# Patient Record
Sex: Male | Born: 1995 | Hispanic: Yes | Marital: Single | Smoking: Never smoker
Health system: Southern US, Community
[De-identification: ages and names within clinical notes are randomized; demographics above are authoritative.]

## PROBLEM LIST (undated history)

## (undated) DIAGNOSIS — F419 Anxiety disorder, unspecified: Secondary | ICD-10-CM

## (undated) DIAGNOSIS — F32A Depression, unspecified: Secondary | ICD-10-CM

## (undated) HISTORY — DX: Anxiety disorder, unspecified: F41.9

## (undated) HISTORY — DX: Depression, unspecified: F32.A

---
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
## 2017-02-01 ENCOUNTER — Emergency Department (HOSPITAL_BASED_OUTPATIENT_CLINIC_OR_DEPARTMENT_OTHER): Payer: BLUE CROSS/BLUE SHIELD

## 2017-02-01 ENCOUNTER — Emergency Department (HOSPITAL_BASED_OUTPATIENT_CLINIC_OR_DEPARTMENT_OTHER)
Admission: EM | Admit: 2017-02-01 | Discharge: 2017-02-01 | Disposition: A | Discharge status: Home / Self Care | Payer: BLUE CROSS/BLUE SHIELD | Attending: Emergency Medicine | Admitting: Emergency Medicine

## 2017-02-01 ENCOUNTER — Encounter (HOSPITAL_BASED_OUTPATIENT_CLINIC_OR_DEPARTMENT_OTHER): Payer: Self-pay | Admitting: *Deleted

## 2017-02-01 DIAGNOSIS — R197 Diarrhea, unspecified: Secondary | ICD-10-CM | POA: Diagnosis not present

## 2017-02-01 DIAGNOSIS — R1031 Right lower quadrant pain: Secondary | ICD-10-CM | POA: Diagnosis present

## 2017-02-01 DIAGNOSIS — N433 Hydrocele, unspecified: Secondary | ICD-10-CM

## 2017-02-01 DIAGNOSIS — N5082 Scrotal pain: Secondary | ICD-10-CM

## 2017-02-01 LAB — CBC WITH DIFFERENTIAL/PLATELET
Basophils Absolute: 0 10*3/uL (ref 0.0–0.1)
Basophils Relative: 0 %
EOS ABS: 0.1 10*3/uL (ref 0.0–0.7)
EOS PCT: 1 %
HCT: 42.1 % (ref 39.0–52.0)
HEMOGLOBIN: 14.5 g/dL (ref 13.0–17.0)
LYMPHS ABS: 1.7 10*3/uL (ref 0.7–4.0)
LYMPHS PCT: 22 %
MCH: 29.7 pg (ref 26.0–34.0)
MCHC: 34.4 g/dL (ref 30.0–36.0)
MCV: 86.3 fL (ref 78.0–100.0)
MONOS PCT: 10 %
Monocytes Absolute: 0.8 10*3/uL (ref 0.1–1.0)
Neutro Abs: 5.2 10*3/uL (ref 1.7–7.7)
Neutrophils Relative %: 67 %
PLATELETS: 231 10*3/uL (ref 150–400)
RBC: 4.88 MIL/uL (ref 4.22–5.81)
RDW: 13.3 % (ref 11.5–15.5)
WBC: 7.7 10*3/uL (ref 4.0–10.5)

## 2017-02-01 LAB — COMPREHENSIVE METABOLIC PANEL
ALK PHOS: 50 U/L (ref 38–126)
ALT: 13 U/L — AB (ref 17–63)
ANION GAP: 6 (ref 5–15)
AST: 14 U/L — ABNORMAL LOW (ref 15–41)
Albumin: 4.3 g/dL (ref 3.5–5.0)
BUN: 17 mg/dL (ref 6–20)
CALCIUM: 9.1 mg/dL (ref 8.9–10.3)
CO2: 26 mmol/L (ref 22–32)
CREATININE: 0.86 mg/dL (ref 0.61–1.24)
Chloride: 106 mmol/L (ref 101–111)
Glucose, Bld: 82 mg/dL (ref 65–99)
Potassium: 4 mmol/L (ref 3.5–5.1)
SODIUM: 138 mmol/L (ref 135–145)
Total Bilirubin: 0.8 mg/dL (ref 0.3–1.2)
Total Protein: 7.4 g/dL (ref 6.5–8.1)

## 2017-02-01 LAB — URINALYSIS, ROUTINE W REFLEX MICROSCOPIC
Bilirubin Urine: NEGATIVE
Glucose, UA: NEGATIVE mg/dL
Hgb urine dipstick: NEGATIVE
Ketones, ur: NEGATIVE mg/dL
Leukocytes, UA: NEGATIVE
Nitrite: NEGATIVE
Protein, ur: NEGATIVE mg/dL
Specific Gravity, Urine: 1.027 (ref 1.005–1.030)
pH: 6 (ref 5.0–8.0)

## 2017-02-01 MED ORDER — ONDANSETRON HCL 4 MG/2ML IJ SOLN
4.0000 mg | Freq: Once | INTRAMUSCULAR | Status: AC
  Administered 2017-02-01: 4 mg via INTRAVENOUS
  Filled 2017-02-01: qty 2

## 2017-02-01 MED ORDER — DOXYCYCLINE HYCLATE 100 MG PO TABS
100.0000 mg | ORAL_TABLET | Freq: Once | ORAL | Status: AC
Start: 2017-02-01 — End: 2017-02-01
  Administered 2017-02-01: 100 mg via ORAL
  Filled 2017-02-01: qty 1

## 2017-02-01 MED ORDER — CEFTRIAXONE SODIUM 250 MG IJ SOLR
250.0000 mg | Freq: Once | INTRAMUSCULAR | Status: AC
  Administered 2017-02-01: 250 mg via INTRAMUSCULAR
  Filled 2017-02-01: qty 250

## 2017-02-01 MED ORDER — IOPAMIDOL (ISOVUE-300) INJECTION 61%
100.0000 mL | Freq: Once | INTRAVENOUS | Status: AC | PRN
  Administered 2017-02-01: 100 mL via INTRAVENOUS

## 2017-02-01 MED ORDER — SODIUM CHLORIDE 0.9 % IV BOLUS (SEPSIS)
1000.0000 mL | Freq: Once | INTRAVENOUS | Status: AC
  Administered 2017-02-01: 1000 mL via INTRAVENOUS

## 2017-02-01 MED ORDER — DOXYCYCLINE HYCLATE 100 MG PO CAPS: 100.0000 mg | ORAL_CAPSULE | Freq: Two times a day (BID) | ORAL | 0 refills | Status: DC

## 2017-02-01 MED ORDER — LIDOCAINE HCL (PF) 2 % IJ SOLN
INTRAMUSCULAR | Status: AC
  Filled 2017-02-01: qty 2

## 2017-02-01 MED ORDER — MORPHINE SULFATE (PF) 4 MG/ML IV SOLN
4.0000 mg | Freq: Once | INTRAVENOUS | Status: AC
  Administered 2017-02-01: 4 mg via INTRAVENOUS
  Filled 2017-02-01: qty 1

## 2017-02-01 MED ORDER — NAPROXEN 375 MG PO TABS: 375.0000 mg | ORAL_TABLET | Freq: Two times a day (BID) | ORAL | 0 refills | Status: DC

## 2017-02-01 NOTE — ED Triage Notes (Signed)
Pt reports pain to his right lower abd radiating to his low back x Saturday night. Pt is smiling and laughing at triage, in nad. Rate pain at 5/10, yesterday morning at it's worst at 7/10.denies any hematuria.

## 2017-02-01 NOTE — ED Notes (Signed)
Patient in u/s and CT at this time

## 2017-02-01 NOTE — ED Provider Notes (Addendum)
[facility]   CSN: [id number] Arrival date & time: 02/01/17  1016     History   Chief Complaint Chief Complaint  Patient presents with  . Abdominal Pain    HPI Miguel Dunn is a 21 y.o. male.  HPI Previously healthy 21 year old male who presents with dysuria and testicular pain. The patient states that for the last several days, he has had progressively worsening pain with urination. It started as urinary frequency and mild pain 3 days ago. Since then, he has developed lower, suprapubic abdominal pain that occasionally radiates into his flanks. He also has associated occasional radiation into his bilateral testes, worse on the right. He also endorses dysuria and burning with urination but no persistent urinary frequency. No nausea or vomiting. No fevers or chills. Denies any sexual contacts and states that he is not sexually active.  History reviewed. No pertinent past medical history.  There are no active problems to display for this patient.   History reviewed. No pertinent surgical history.     Home Medications    Prior to Admission medications   Medication Sig Start Date End Date Taking? Authorizing Provider  doxycycline (VIBRAMYCIN) 100 MG capsule Take 1 capsule (100 mg total) by mouth 2 (two) times daily. 02/01/17   Shaune Pollack, MD  naproxen (NAPROSYN) 375 MG tablet Take 1 tablet (375 mg total) by mouth 2 (two) times daily. 02/01/17   Shaune Pollack, MD    Family History History reviewed. No pertinent family history.  Social History Social History  Substance Use Topics  . Smoking status: Never Smoker  . Smokeless tobacco: Never Used  . Alcohol use Not on file     Allergies   Augmentin [amoxicillin-pot clavulanate]   Review of Systems Review of Systems  Constitutional: Positive for fatigue. Negative for chills and fever.  HENT: Negative for congestion and rhinorrhea.   Eyes: Negative for visual disturbance.  Respiratory:  Negative for cough and wheezing.   Cardiovascular: Negative for chest pain and leg swelling.  Gastrointestinal: Positive for abdominal pain and diarrhea. Negative for nausea and vomiting.  Genitourinary: Positive for dysuria and testicular pain. Negative for flank pain, penile swelling and scrotal swelling.  Musculoskeletal: Negative for neck pain and neck stiffness.  Skin: Negative for rash and wound.  Allergic/Immunologic: Negative for immunocompromised state.  Neurological: Negative for syncope, weakness and headaches.  All other systems reviewed and are negative.    Physical Exam Updated Vital Signs BP 122/65 (BP Location: Right Arm)   Pulse 61   Temp 97.8 F (36.6 C) (Oral)   Resp 18   Ht 5\' 11"  (1.803 m)   Wt 210 lb (95.3 kg)   SpO2 100%   BMI 29.29 kg/m   Physical Exam  Constitutional: He is oriented to person, place, and time. He appears well-developed and well-nourished. No distress.  HENT:  Head: Normocephalic and atraumatic.  Eyes: Conjunctivae are normal.  Neck: Neck supple.  Cardiovascular: Normal rate, regular rhythm and normal heart sounds.  Exam reveals no friction rub.   No murmur heard. Pulmonary/Chest: Effort normal and breath sounds normal. No respiratory distress. He has no wheezes. He has no rales.  Abdominal: Soft. Bowel sounds are normal. He exhibits no distension. There is tenderness (moderate RLQ). There is no rebound and no guarding.  Genitourinary:  Genitourinary Comments: Testes descended bilaterally. There is mild tenderness over the right posterior testicle. Cremasteric is intact bilaterally with normal testicular lie. No erythema. No penile lesions or abnormality.  Musculoskeletal: He exhibits no edema.  Neurological: He is alert and oriented to person, place, and time. He exhibits normal muscle tone.  Skin: Skin is warm. Capillary refill takes less than 2 seconds.  Psychiatric: He has a normal mood and affect.  Nursing note and vitals  reviewed.    ED Treatments / Results  Labs (all labs ordered are listed, but only abnormal results are displayed) Labs Reviewed  COMPREHENSIVE METABOLIC PANEL - Abnormal; Notable for the following:       Result Value   AST 14 (*)    ALT 13 (*)    All other components within normal limits  URINALYSIS, ROUTINE W REFLEX MICROSCOPIC  CBC WITH DIFFERENTIAL/PLATELET  GC/CHLAMYDIA PROBE AMP ([place]) NOT [facility]    EKG  EKG Interpretation None       Radiology [place] Scrotum  Result Date: 02/01/2017 CLINICAL DATA:  21 year old male with lower abdomen, back and scrotal pain for 3-4 days. Initial encounter. EXAM: SCROTAL ULTRASOUND DOPPLER ULTRASOUND OF THE TESTICLES TECHNIQUE: Complete ultrasound examination of the testicles, epididymis, and other scrotal structures was performed. Color and spectral Doppler ultrasound were also utilized to evaluate blood flow to the testicles. COMPARISON:  CT Abdomen and Pelvis 1344 hours today. FINDINGS: Right testicle Measurements: 4.7 x 2.4 x 2.8 cm. No mass or microlithiasis visualized. Left testicle Measurements: 4.3 x 2.1 x 2.7 cm. No mass or microlithiasis visualized. Right epididymis:  Normal in size and appearance. Left epididymis:  Normal in size and appearance. Hydrocele:  None visualized. Varicocele:  Borderline to mild right varicocele (images 9 and 10). Pulsed Doppler interrogation of both testes demonstrates normal low resistance arterial and venous waveforms bilaterally. IMPRESSION: No evidence of testicular torsion and negative study except for borderline to mild right varicocele. Electronically Signed   By: Odessa FlemingH  Hall M.D.   On: 02/01/2017 14:21   Ct Abdomen Pelvis W Contrast  Result Date: 02/01/2017 CLINICAL DATA:  21 year old male with lower abdominal pain greater on the right. Pain with urination/bowel movement. Initial encounter. EXAM: CT ABDOMEN AND PELVIS WITH CONTRAST TECHNIQUE: Multidetector CT imaging of the abdomen and pelvis was  performed using the standard protocol following bolus administration of intravenous contrast. CONTRAST:  100mL ISOVUE-300 IOPAMIDOL (ISOVUE-300) INJECTION 61% COMPARISON:  No comparison CT. FINDINGS: Lower chest: Lung bases clear.  Heart size within normal limits. Hepatobiliary: No hepatic lesion or calcified gallstones. Pancreas: No mass or inflammation. Spleen: Spleen top-normal in length.  No splenic mass. Adrenals/Urinary Tract: No renal or ureteral obstructing stone or evidence hydronephrosis. 1.1 cm left renal cyst. No worrisome renal or adrenal lesion. Noncontrast filled views of the urinary bladder unremarkable. Stomach/Bowel: Portions of the colon are under distended and evaluation limited. No extraluminal bowel inflammatory process, free fluid or free air. Specifically, no inflammation surrounds the appendix or terminal ileum. Vascular/Lymphatic: No aneurysm or large vessel occlusion. No adenopathy. Reproductive: Negative Other: No inguinal hernia. Musculoskeletal: Osseous abnormality. IMPRESSION: No renal or ureteral obstructing stone or evidence hydronephrosis. 1.1 cm left renal cyst. Portions of the colon are under distended and evaluation limited. No extraluminal bowel inflammatory process, free fluid or free air. Specifically, no inflammation surrounds the appendix or terminal ileum. Electronically Signed   By: Lacy DuverneySteven  Olson M.D.   On: 02/01/2017 14:08   [place] Art/ven Flow Abd Pelv Doppler Limited  Result Date: 02/01/2017 CLINICAL DATA:  21 year old male with lower abdomen, back and scrotal pain for 3-4 days. Initial encounter. EXAM: SCROTAL ULTRASOUND DOPPLER ULTRASOUND OF THE TESTICLES TECHNIQUE: Complete ultrasound  examination of the testicles, epididymis, and other scrotal structures was performed. Color and spectral Doppler ultrasound were also utilized to evaluate blood flow to the testicles. COMPARISON:  CT Abdomen and Pelvis 1344 hours today. FINDINGS: Right testicle Measurements: 4.7 x 2.4 x  2.8 cm. No mass or microlithiasis visualized. Left testicle Measurements: 4.3 x 2.1 x 2.7 cm. No mass or microlithiasis visualized. Right epididymis:  Normal in size and appearance. Left epididymis:  Normal in size and appearance. Hydrocele:  None visualized. Varicocele:  Borderline to mild right varicocele (images 9 and 10). Pulsed Doppler interrogation of both testes demonstrates normal low resistance arterial and venous waveforms bilaterally. IMPRESSION: No evidence of testicular torsion and negative study except for borderline to mild right varicocele. Electronically Signed   By: Odessa Fleming M.D.   On: 02/01/2017 14:21    Procedures Procedures (including critical care time)  Medications Ordered in ED Medications  lidocaine (XYLOCAINE) 2 % injection (not administered)  sodium chloride 0.9 % bolus 1,000 mL (0 mLs Intravenous Stopped 02/01/17 1436)  morphine 4 MG/ML injection 4 mg (4 mg Intravenous Given 02/01/17 1255)  ondansetron (ZOFRAN) injection 4 mg (4 mg Intravenous Given 02/01/17 1255)  iopamidol (ISOVUE-300) 61 % injection 100 mL (100 mLs Intravenous Contrast Given 02/01/17 1337)  cefTRIAXone (ROCEPHIN) injection 250 mg (250 mg Intramuscular Given 02/01/17 1459)  doxycycline (VIBRA-TABS) tablet 100 mg (100 mg Oral Given 02/01/17 1458)     Initial Impression / Assessment and Plan / ED Course  I have reviewed the triage vital signs and the nursing notes.  Pertinent labs & imaging results that were available during my care of the patient were reviewed by me and considered in my medical decision making (see chart for details).     21 year old male who presents with dysuria and lower abdominal pain. Initial differential is broad and includes: testicular etiology such as epididymitis or orchitis, less likely torsion, UTI, stone, also must consider appendicitis given right lower quadrant tenderness. Will obtain ultrasound as well as CT. Otherwise, urinalysis obtained.   Lab work and imaging is  unremarkable. CT negative for appendicitis or other acute abnormality. Ultrasound does show hydrocele but no evidence of torsion. Labwork unremarkable. Urinalysis without evidence of UTI or hematuria. Will treat for possible early epididymitis or urethritis with Rocephin and doxycycline. Urinary GC also sent. Otherwise, patient is well-appearing, tolerating by mouth, and will discharge home.  Final Clinical Impressions(s) / ED Diagnoses   Final diagnoses:  Scrotal pain  Hydrocele, unspecified hydrocele type    New Prescriptions Discharge Medication List as of 02/01/2017  3:00 PM    START taking these medications   Details  doxycycline (VIBRAMYCIN) 100 MG capsule Take 1 capsule (100 mg total) by mouth 2 (two) times daily., Starting Tue 02/01/2017, Print    naproxen (NAPROSYN) 375 MG tablet Take 1 tablet (375 mg total) by mouth 2 (two) times daily., Starting Tue 02/01/2017, Print         Shaune Pollack, MD 02/01/17 1710    Shaune Pollack, MD 02/01/17 [phone]

## 2017-02-02 LAB — GC/CHLAMYDIA PROBE AMP (~~LOC~~) NOT AT ARMC
CHLAMYDIA, DNA PROBE: NEGATIVE
Neisseria Gonorrhea: NEGATIVE

## 2018-12-29 IMAGING — US US ART/VEN ABD/PELV/SCROTUM DOPPLER COMPLETE
1 series · 14 of 25 positions shown · non-contrast
Comparison: CT Abdomen and Pelvis 0299 hours today.

CLINICAL DATA: 20-year-old male with lower abdomen, back and
scrotal pain for 3-4 days. Initial encounter.

EXAM:
SCROTAL ULTRASOUND
DOPPLER ULTRASOUND OF THE TESTICLES
TECHNIQUE: Complete ultrasound examination of the testicles, epididymis, and
other scrotal structures was performed. Color and spectral Doppler
ultrasound were also utilized to evaluate blood flow to the
testicles.

[Series 1: us art/ven abd/pelv/scrotum doppler complete · 0.07mm/px · 14 of 36 slices shown]
[im 1/36]
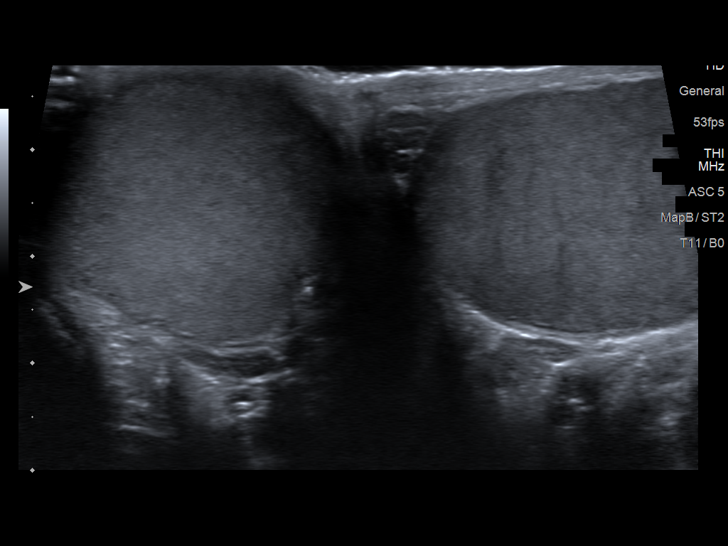
[im 3/36]
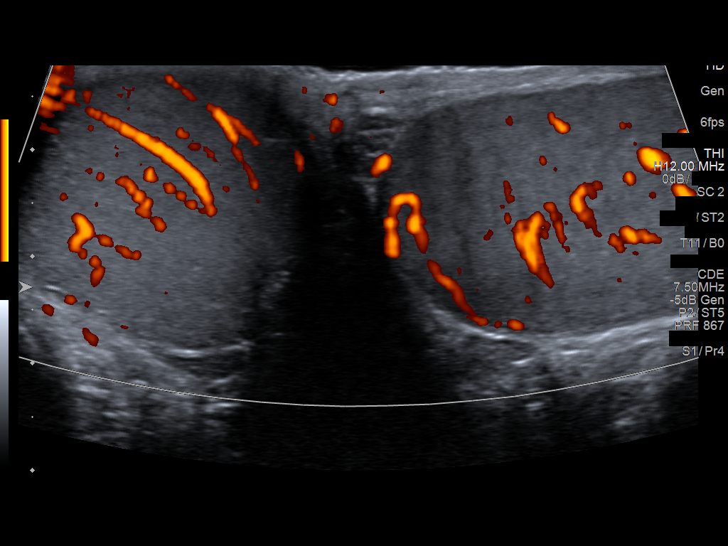
[im 6/36]
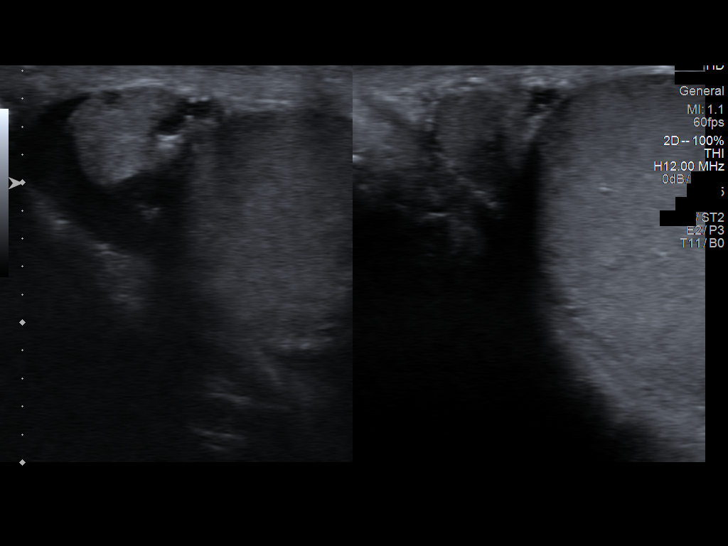
[im 9/36]
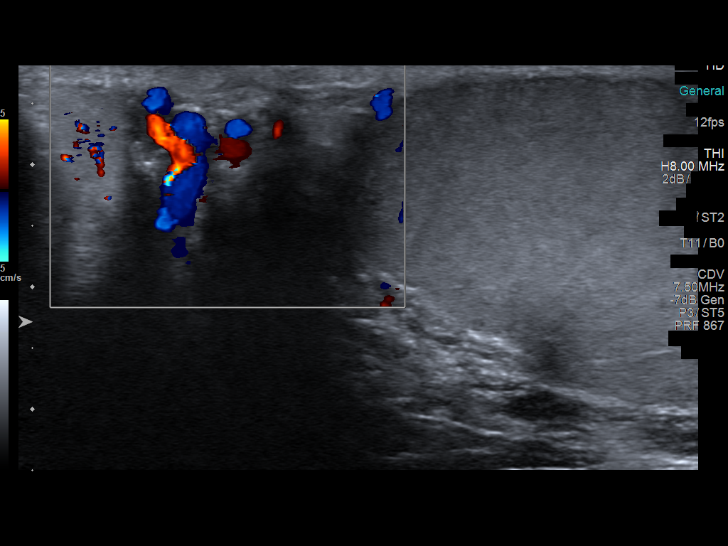
[im 12/36]
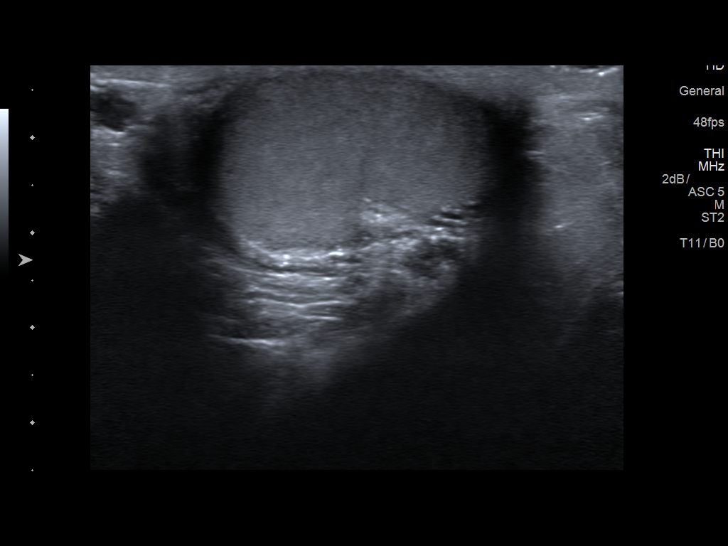
[im 14/36]
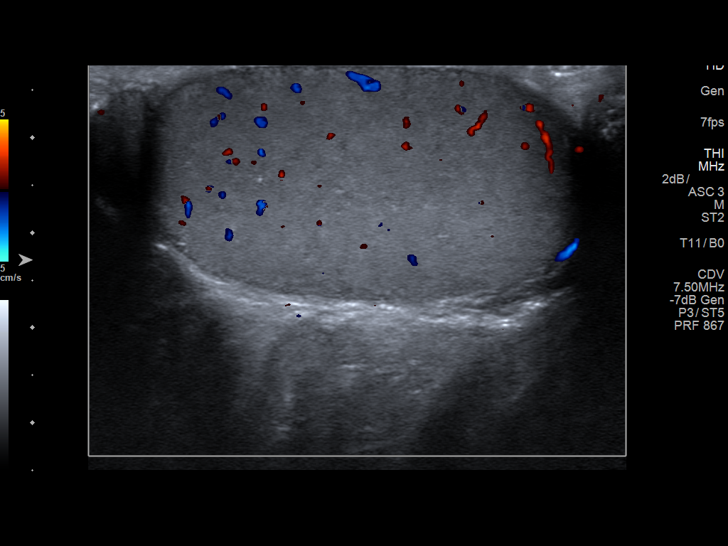
[im 17/36]
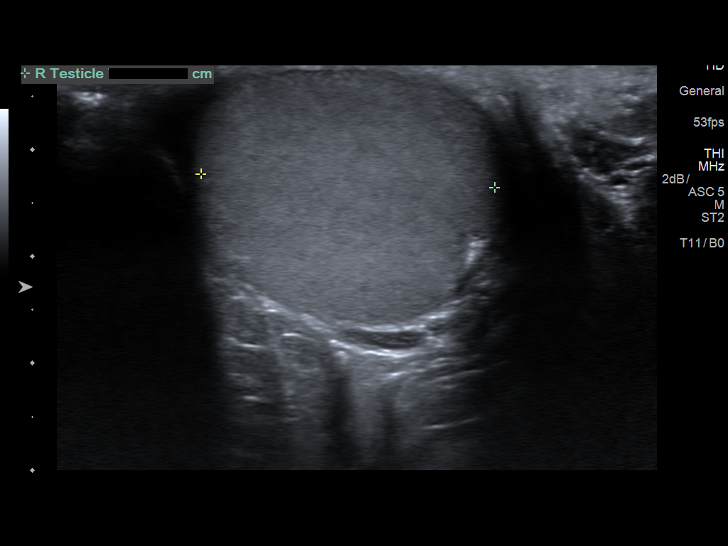
[im 19/36]
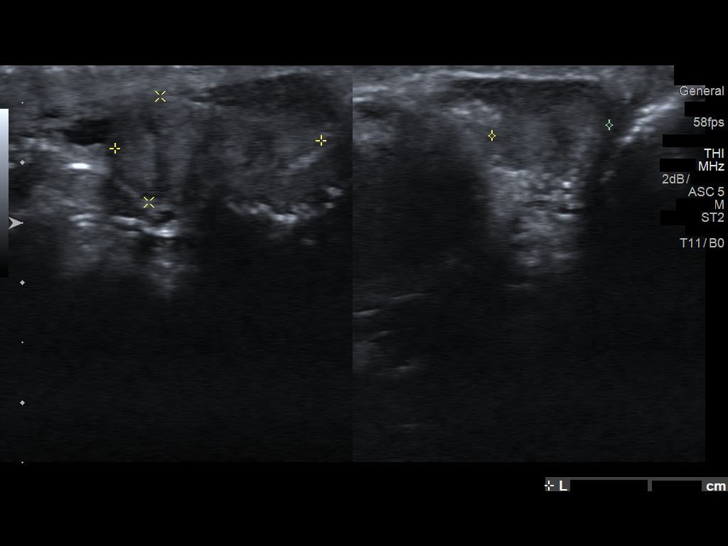
[im 22/36]
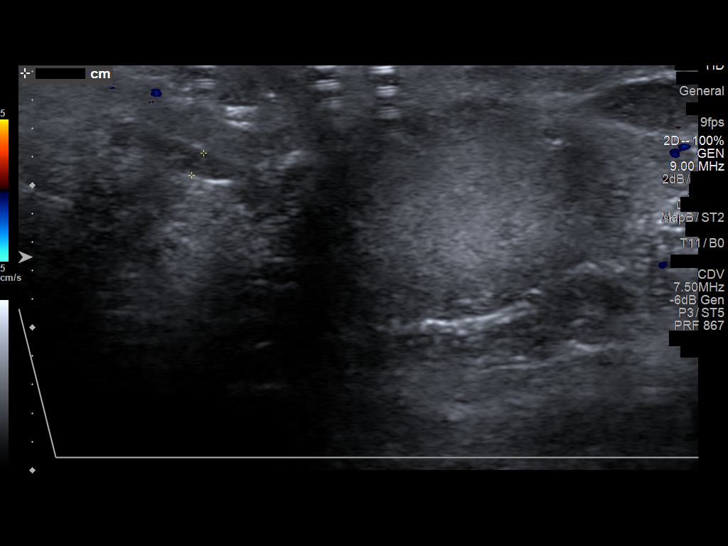
[im 24/36]
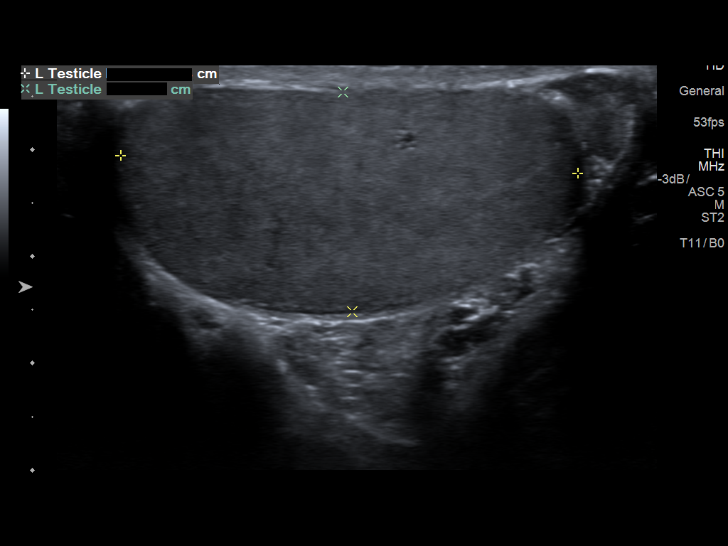
[im 27/36]
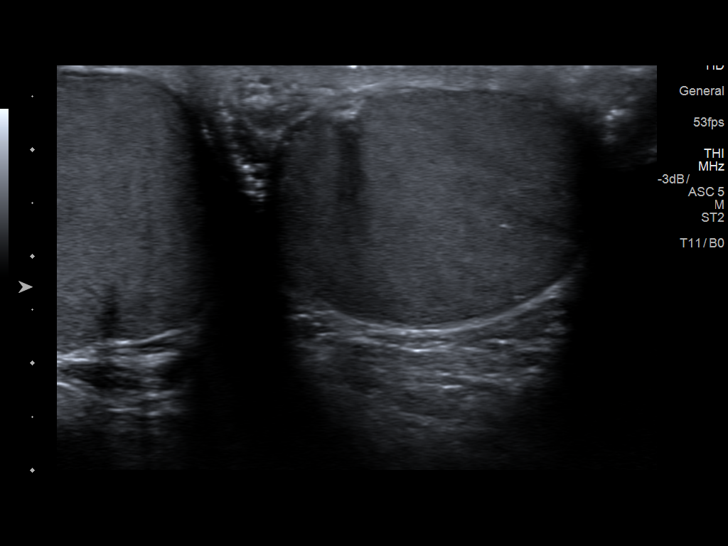
[im 30/36]
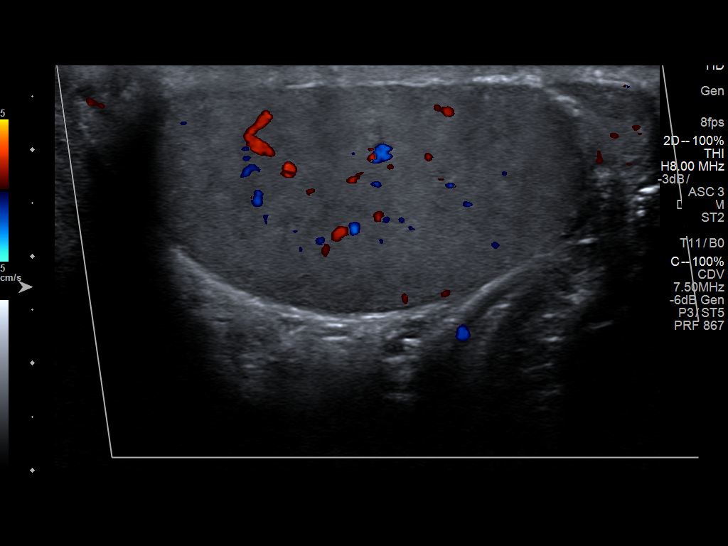
[im 33/36]
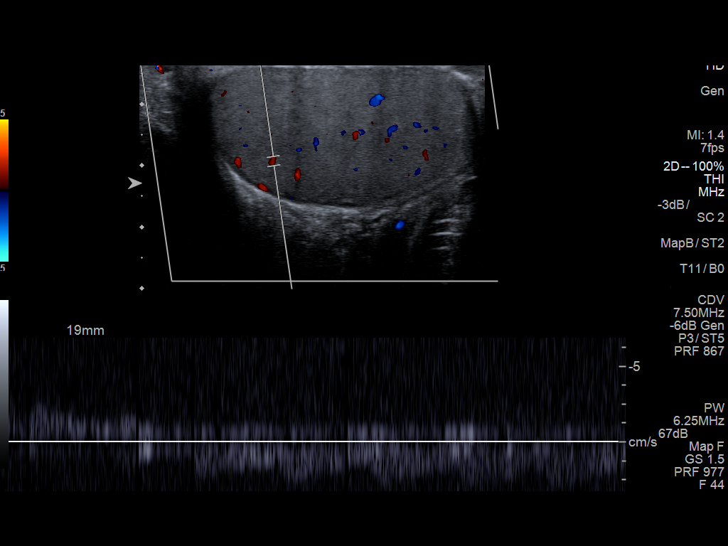
[im 36/36]
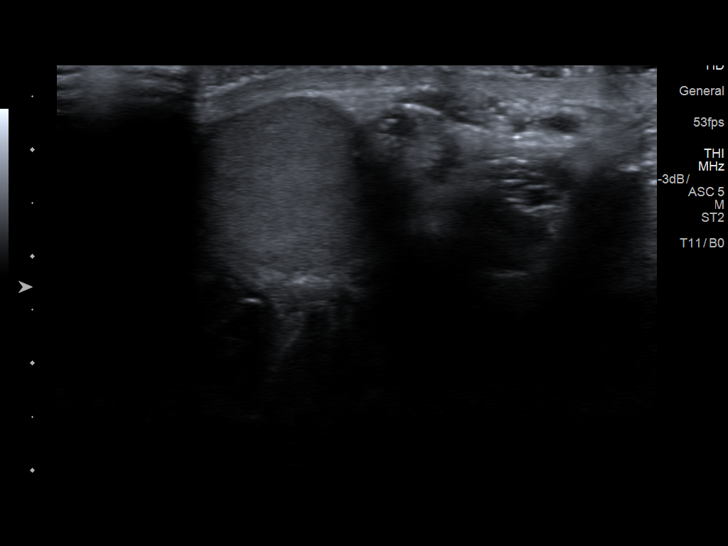

[14 of 25 positions shown; findings below may reference images not displayed]

FINDINGS: Right testicle

Measurements: 4.7 x 2.4 x 2.8 cm. No mass or microlithiasis
visualized.

Left testicle

Measurements: 4.3 x 2.1 x 2.7 cm. No mass or microlithiasis
visualized.

Right epididymis:  Normal in size and appearance.

Left epididymis:  Normal in size and appearance.

Hydrocele:  None visualized.

Varicocele:  Borderline to mild right varicocele (images 9 and 10).

Pulsed Doppler interrogation of both testes demonstrates normal low
resistance arterial and venous waveforms bilaterally.
IMPRESSION: No evidence of testicular torsion and negative study except for
borderline to mild right varicocele.

## 2019-02-23 ENCOUNTER — Encounter (HOSPITAL_BASED_OUTPATIENT_CLINIC_OR_DEPARTMENT_OTHER): Payer: Self-pay | Admitting: *Deleted

## 2019-02-23 ENCOUNTER — Other Ambulatory Visit: Payer: Self-pay

## 2019-02-23 ENCOUNTER — Emergency Department (HOSPITAL_BASED_OUTPATIENT_CLINIC_OR_DEPARTMENT_OTHER)
Admission: EM | Admit: 2019-02-23 | Discharge: 2019-02-23 | Disposition: A | Discharge status: Home / Self Care | Payer: BLUE CROSS/BLUE SHIELD | Attending: Emergency Medicine | Admitting: Emergency Medicine

## 2019-02-23 ENCOUNTER — Emergency Department (HOSPITAL_BASED_OUTPATIENT_CLINIC_OR_DEPARTMENT_OTHER): Payer: BLUE CROSS/BLUE SHIELD

## 2019-02-23 DIAGNOSIS — Z79899 Other long term (current) drug therapy: Secondary | ICD-10-CM | POA: Insufficient documentation

## 2019-02-23 DIAGNOSIS — R05 Cough: Secondary | ICD-10-CM | POA: Insufficient documentation

## 2019-02-23 DIAGNOSIS — R059 Cough, unspecified: Secondary | ICD-10-CM

## 2019-02-23 MED ORDER — FLUTICASONE PROPIONATE 50 MCG/ACT NA SUSP: 1.0000 | Freq: Every day | NASAL | 0 refills | Status: DC

## 2019-02-23 MED ORDER — BENZONATATE 100 MG PO CAPS: 100.0000 mg | ORAL_CAPSULE | Freq: Three times a day (TID) | ORAL | 0 refills | Status: DC

## 2019-02-23 NOTE — Discharge Instructions (Addendum)
You were seen in the emergency today for upper respiratory symptoms, we suspect your symptoms are related to allergies or a virus at this time.  We have prescribed you multiple medications to treat your symptoms.   -Flonase to be used 1 spray in each nostril daily.  This medication is used to treat your congestion.  -Tessalon can be taken once every 8 hours as needed.  This medication is used to treat your cough.  Take motrin/tylenol per over the counter as needed for pain.   We have prescribed you new medication(s) today. Discuss the medications prescribed today with your pharmacist as they can have adverse effects and interactions with your other medicines including over the counter and prescribed medications. Seek medical evaluation if you start to experience new or abnormal symptoms after taking one of these medicines, seek care immediately if you start to experience difficulty breathing, feeling of your throat closing, facial swelling, or rash as these could be indications of a more serious allergic reaction  You will need to follow-up with your primary care provider in 2-3 if your symptoms have not improved.  If you do not have a primary care provider one is provided in your discharge instructions.  Return to the emergency department for any new or worsening symptoms including but not limited to persistent fever for 5 days, difficulty breathing, chest pain, rashes, passing out, or any other concerns.

## 2019-02-23 NOTE — ED Provider Notes (Signed)
[facility] EMERGENCY DEPARTMENT Provider Note   CSN: [id number] Arrival date & time: 02/23/19  1542    History   Chief Complaint Chief Complaint  Patient presents with  . Cough    HPI Miguel Dunn is a 23 y.o. male without significant past medical hx who presents to the ED with complaints of URI sxs that started 2-3 days prior. Patient reports initial sore throat has resolved, but remains with congestion and dry cough. Sxs are constant. No alleviating/aggravating factors. Sick contacts within family w/ similar. Denies fever, chills, ear pain, dyspnea, or chest pain. No recent foreign travel or exposure to coronavirus.      HPI  History reviewed. No pertinent past medical history.  There are no active problems to display for this patient.   History reviewed. No pertinent surgical history.      Home Medications    Prior to Admission medications   Medication Sig Start Date End Date Taking? Authorizing Provider  benzonatate (TESSALON) 100 MG capsule Take 1 capsule (100 mg total) by mouth every 8 (eight) hours. 02/23/19   Petrucelli, Samantha R, PA-C  doxycycline (VIBRAMYCIN) 100 MG capsule Take 1 capsule (100 mg total) by mouth 2 (two) times daily. 02/01/17   Shaune Pollack, MD  fluticasone (FLONASE) 50 MCG/ACT nasal spray Place 1 spray into both nostrils daily. 02/23/19   Petrucelli, Samantha R, PA-C  naproxen (NAPROSYN) 375 MG tablet Take 1 tablet (375 mg total) by mouth 2 (two) times daily. 02/01/17   Shaune Pollack, MD    Family History History reviewed. No pertinent family history.  Social History Social History   Tobacco Use  . Smoking status: Never Smoker  . Smokeless tobacco: Never Used  Substance Use Topics  . Alcohol use: Not on file  . Drug use: Not on file     Allergies   Augmentin [amoxicillin-pot clavulanate]   Review of Systems Review of Systems  Constitutional: Negative for chills and fever.  HENT: Positive for congestion and  sore throat (resolved). Negative for ear pain.   Respiratory: Positive for cough. Negative for shortness of breath and wheezing.   Cardiovascular: Negative for chest pain.  Gastrointestinal: Negative for abdominal pain, diarrhea and vomiting.  Neurological: Negative for syncope.    Physical Exam Updated Vital Signs BP 133/82 (BP Location: Right Arm)   Pulse (!) 104   Temp 98.3 F (36.8 C) (Oral)   Resp 18   Ht 5\' 11"  (1.803 m)   Wt 99.8 kg   SpO2 99%   BMI 30.68 kg/m   Physical Exam Vitals signs and nursing note reviewed.  Constitutional:      General: He is not in acute distress.    Appearance: He is well-developed. He is not toxic-appearing.  HENT:     Head: Normocephalic and atraumatic.     Right Ear: Tympanic membrane, ear canal and external ear normal.     Left Ear: Tympanic membrane, ear canal and external ear normal.     Nose: Congestion present.     Right Sinus: No maxillary sinus tenderness or frontal sinus tenderness.     Left Sinus: No maxillary sinus tenderness or frontal sinus tenderness.     Mouth/Throat:     Mouth: Mucous membranes are moist.     Pharynx: Uvula midline. No pharyngeal swelling, oropharyngeal exudate or posterior oropharyngeal erythema.     Comments: Posterior oropharynx is symmetric appearing. Patient tolerating own secretions without difficulty. No trismus. No drooling. No hot potato voice. No swelling  beneath the tongue, submandibular compartment is soft.  Eyes:     General:        Right eye: No discharge.        Left eye: No discharge.     Conjunctiva/sclera: Conjunctivae normal.  Neck:     Musculoskeletal: Neck supple. No edema or neck rigidity.  Cardiovascular:     Rate and Rhythm: Normal rate and regular rhythm.  Pulmonary:     Effort: Pulmonary effort is normal. No respiratory distress.     Breath sounds: Normal breath sounds. No wheezing, rhonchi or rales.  Abdominal:     General: There is no distension.     Palpations: Abdomen  is soft.     Tenderness: There is no abdominal tenderness.  Lymphadenopathy:     Cervical: No cervical adenopathy.  Skin:    General: Skin is warm and dry.     Findings: No rash.  Neurological:     Mental Status: He is alert.     Comments: Clear speech.   Psychiatric:        Behavior: Behavior normal.      ED Treatments / Results  Labs (all labs ordered are listed, but only abnormal results are displayed) Labs Reviewed - No data to display  EKG None  Radiology Dg Chest 2 View  Result Date: 02/23/2019 CLINICAL DATA:  Cough for 2 days. EXAM: CHEST - 2 VIEW COMPARISON:  None. FINDINGS: The cardiomediastinal silhouette is unremarkable. There is no evidence of focal airspace disease, pulmonary edema, suspicious pulmonary nodule/mass, pleural effusion, or pneumothorax. No acute bony abnormalities are identified. IMPRESSION: No active cardiopulmonary disease. Electronically Signed   By: Harmon Pier M.D.   On: 02/23/2019 16:22    Procedures Procedures (including critical care time)  Medications Ordered in ED Medications - No data to display   Initial Impression / Assessment and Plan / ED Course  I have reviewed the triage vital signs and the nursing notes.  Pertinent labs & imaging results that were available during my care of the patient were reviewed by me and considered in my medical decision making (see chart for details).    Patient presents with URI type symptoms.  Patient is nontoxic appearing, in no apparent distress, vitals are WNL other than mild tachycardia which normalized on my exam. Patient is afebrile in the ED, lungs are CTA, CXR per triage negative for infiltrate, doubt pneumonia. There is no wheezing or signs of respiratory distress. Sxs onset < 7 days, afebrile, no sinus tenderness, doubt acute bacterial sinusitis. Centor score 0, doubt strep pharyngitis. No evidence of AOM on exam. No meningeal signs. No foreign travel or known coronavirus exposure. Suspect viral  vs allergic etiology at this time and will treat supportively with Flonase and Tessalon. I discussed results, treatment plan, need for PCP follow-up, and return precautions with the patient & his mother at bedside. Provided opportunity for questions, patient & his mother confirmed understanding and are in agreement with plan.    Final Clinical Impressions(s) / ED Diagnoses   Final diagnoses:  Cough    ED Discharge Orders         Ordered    fluticasone (FLONASE) 50 MCG/ACT nasal spray  Daily     02/23/19 1859    benzonatate (TESSALON) 100 MG capsule  Every 8 hours     02/23/19 1859           Petrucelli, Pleas Koch, PA-C 02/23/19 1919    Rolan Bucco, MD 02/23/19 2238

## 2019-02-23 NOTE — ED Triage Notes (Signed)
C/o cough, congestion  X 2 days  Slight sorethroat 2 days ago but is feeling better

## 2019-02-24 ENCOUNTER — Telehealth (HOSPITAL_BASED_OUTPATIENT_CLINIC_OR_DEPARTMENT_OTHER): Payer: Self-pay | Admitting: Emergency Medicine

## 2019-09-10 ENCOUNTER — Other Ambulatory Visit: Payer: Self-pay

## 2019-09-10 DIAGNOSIS — Z20822 Contact with and (suspected) exposure to covid-19: Secondary | ICD-10-CM

## 2019-09-11 LAB — NOVEL CORONAVIRUS, NAA: SARS-CoV-2, NAA: NOT DETECTED

## 2019-09-11 LAB — SPECIMEN STATUS REPORT

## 2020-06-28 IMAGING — CR CHEST - 2 VIEW
2 series · 2 of 2 positions shown · non-contrast
Comparison: None.

CLINICAL DATA: Cough for 2 days.

EXAM:
CHEST - 2 VIEW

[w chest pa]
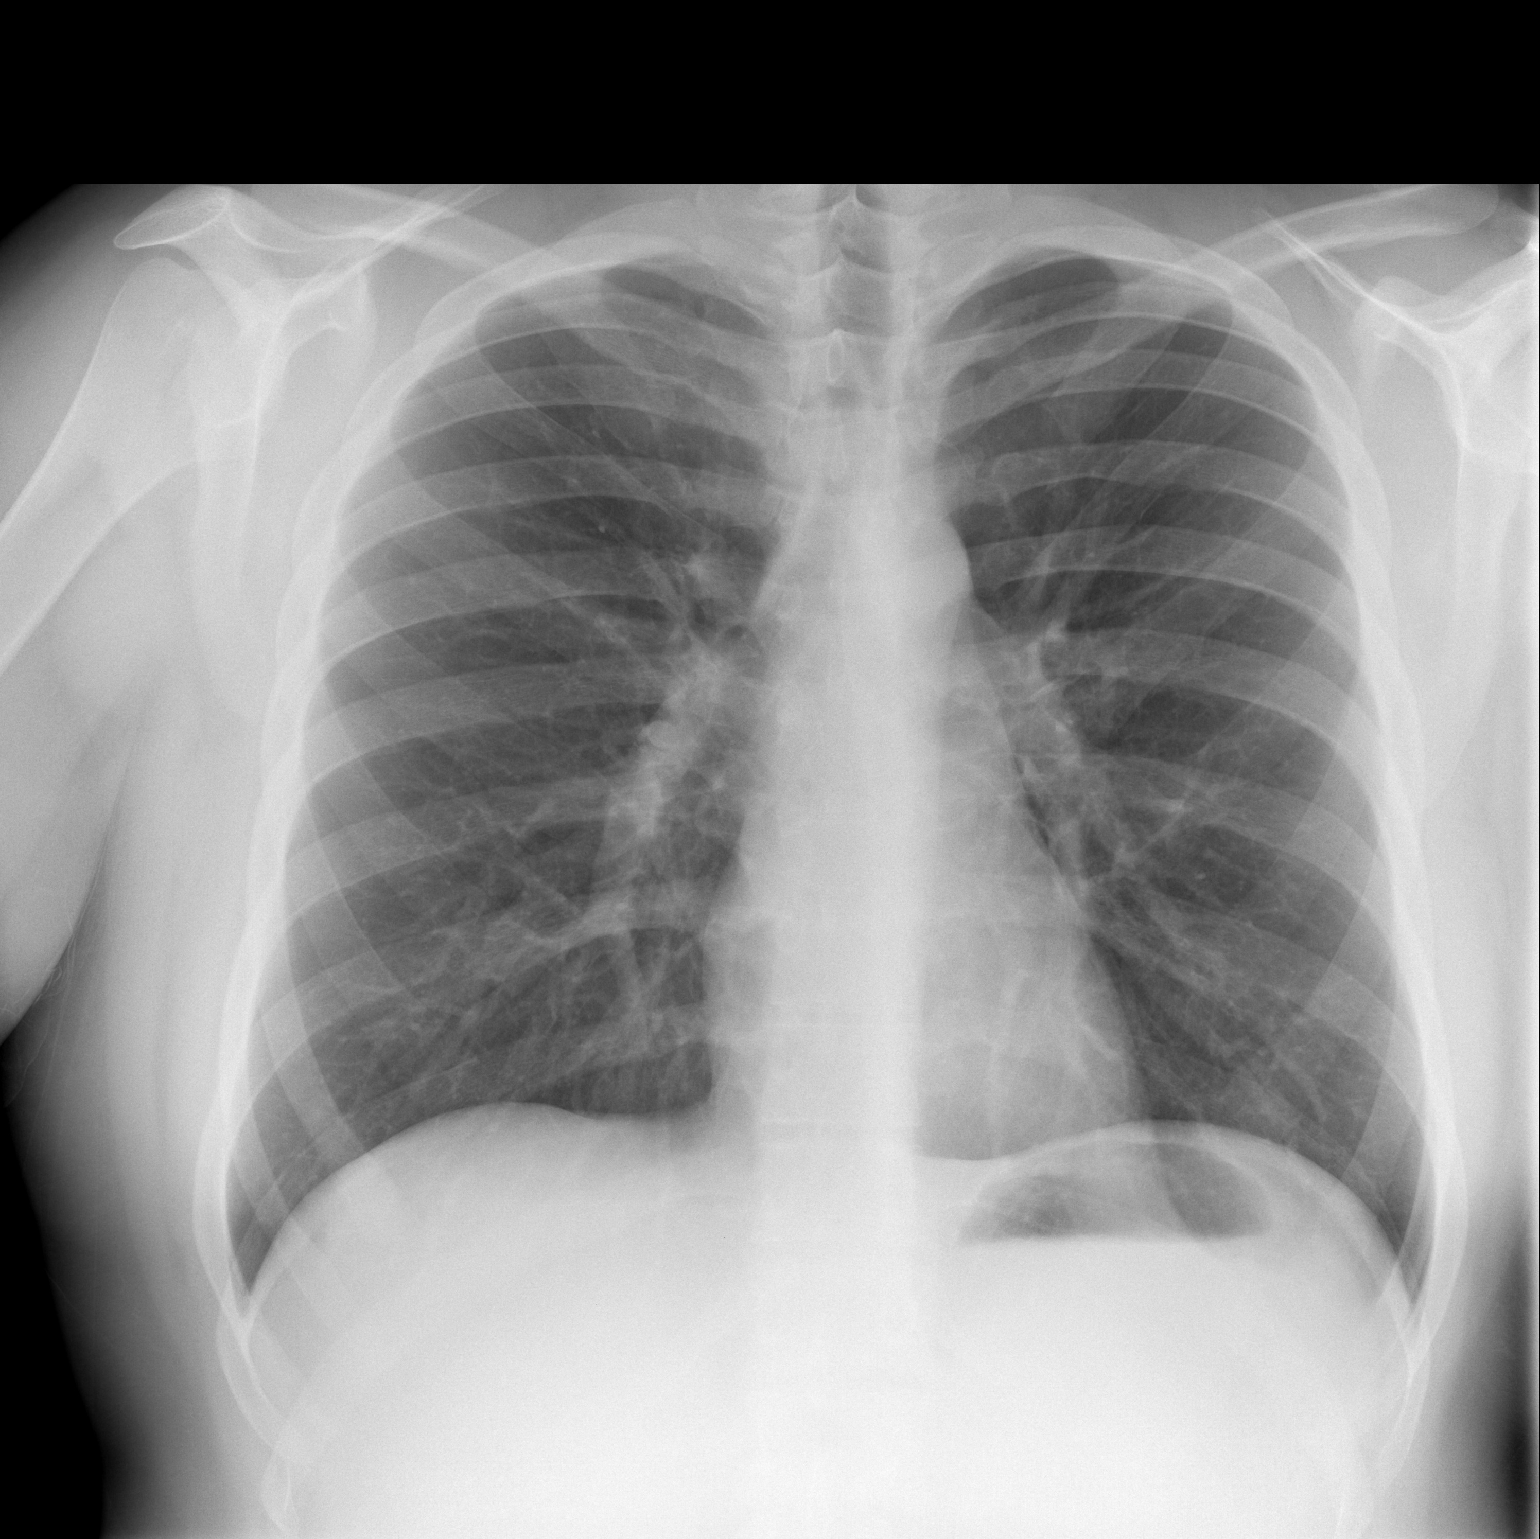

[w chest lat]
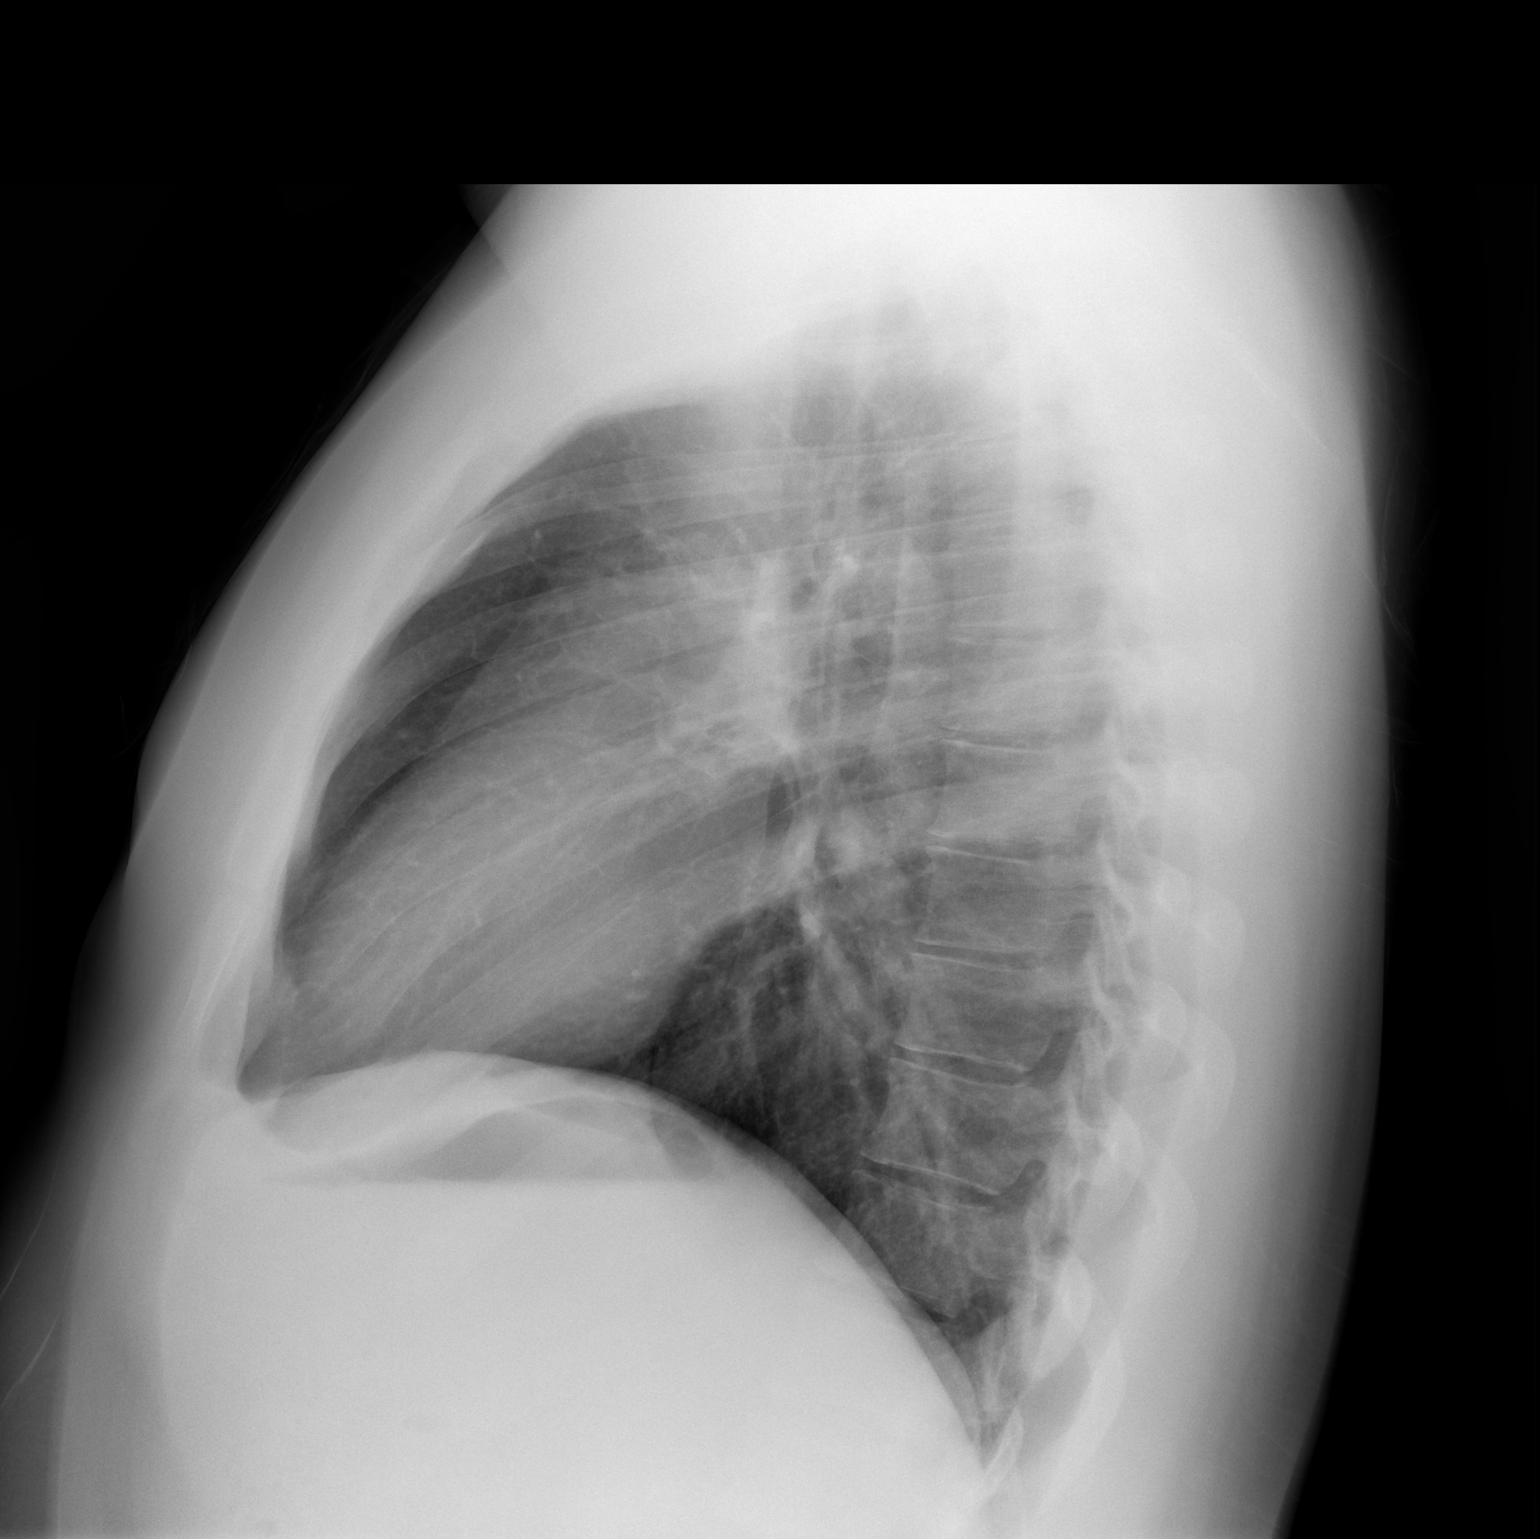

[2 of 2 positions shown; findings below may reference images not displayed]

FINDINGS: The cardiomediastinal silhouette is unremarkable.

There is no evidence of focal airspace disease, pulmonary edema,
suspicious pulmonary nodule/mass, pleural effusion, or pneumothorax.

No acute bony abnormalities are identified.
IMPRESSION: No active cardiopulmonary disease.

## 2022-04-14 ENCOUNTER — Encounter: Payer: Self-pay | Admitting: Family Medicine

## 2022-04-14 ENCOUNTER — Ambulatory Visit (INDEPENDENT_AMBULATORY_CARE_PROVIDER_SITE_OTHER): Payer: 59 | Admitting: Family Medicine

## 2022-04-14 VITALS — BP 110/80 | HR 57 | Temp 97.3°F | Ht 71.0 in | Wt 253.2 lb

## 2022-04-14 DIAGNOSIS — Z6835 Body mass index (BMI) 35.0-35.9, adult: Secondary | ICD-10-CM | POA: Diagnosis not present

## 2022-04-14 DIAGNOSIS — R001 Bradycardia, unspecified: Secondary | ICD-10-CM | POA: Diagnosis not present

## 2022-04-14 DIAGNOSIS — L700 Acne vulgaris: Secondary | ICD-10-CM | POA: Insufficient documentation

## 2022-04-14 DIAGNOSIS — F321 Major depressive disorder, single episode, moderate: Secondary | ICD-10-CM

## 2022-04-14 DIAGNOSIS — F411 Generalized anxiety disorder: Secondary | ICD-10-CM

## 2022-04-14 DIAGNOSIS — R0789 Other chest pain: Secondary | ICD-10-CM

## 2022-04-14 DIAGNOSIS — F410 Panic disorder [episodic paroxysmal anxiety] without agoraphobia: Secondary | ICD-10-CM | POA: Insufficient documentation

## 2022-04-14 DIAGNOSIS — Z7689 Persons encountering health services in other specified circumstances: Secondary | ICD-10-CM | POA: Diagnosis not present

## 2022-04-14 LAB — COMPREHENSIVE METABOLIC PANEL
ALT: 20 U/L (ref 0–53)
AST: 16 U/L (ref 0–37)
Albumin: 4.8 g/dL (ref 3.5–5.2)
Alkaline Phosphatase: 56 U/L (ref 39–117)
BUN: 17 mg/dL (ref 6–23)
CO2: 27 mEq/L (ref 19–32)
Calcium: 9.4 mg/dL (ref 8.4–10.5)
Chloride: 104 mEq/L (ref 96–112)
Creatinine, Ser: 1.11 mg/dL (ref 0.40–1.50)
GFR: 92.61 mL/min (ref >60.00)
Glucose, Bld: 85 mg/dL (ref 70–99)
Potassium: 4 mEq/L (ref 3.5–5.1)
Sodium: 138 mEq/L (ref 135–145)
Total Bilirubin: 0.7 mg/dL (ref 0.2–1.2)
Total Protein: 7.8 g/dL (ref 6.0–8.3)

## 2022-04-14 LAB — LIPID PANEL
Cholesterol: 162 mg/dL (ref 0–200)
HDL: 35.9 mg/dL — ABNORMAL LOW (ref >39.00)
LDL Cholesterol: 99 mg/dL (ref 0–99)
NonHDL: 125.73
Total CHOL/HDL Ratio: 5
Triglycerides: 132 mg/dL (ref 0.0–149.0)
VLDL: 26.4 mg/dL (ref 0.0–40.0)

## 2022-04-14 LAB — HEMOGLOBIN A1C: Hgb A1c MFr Bld: 4.8 % (ref 4.6–6.5)

## 2022-04-14 NOTE — Assessment & Plan Note (Signed)
Given bradycardia ?Coupled with elevated BMI, will check restratification labs ?Counseled on healthy diet and exercise ?

## 2022-04-14 NOTE — Assessment & Plan Note (Addendum)
Asymptomatic ?EKG without rhythm abnormalities, although mildly persistent bradycardia ?Denies any family history of any early cardiac death ?We will work-up infectious, electrolyte, thyroid related issues as associated labs ?Instructed to go to ED if develop any severe cardiac symptoms ?

## 2022-04-14 NOTE — Assessment & Plan Note (Signed)
Associated with shoulder elevation and tender to palpation ?Do not suspect that chest pain is related to bradycardia, I believe it is musculoskeletal in nature ?Nevertheless doing work-up for bradycardia and cardiac restratification labs ?Cardiac return precautions discussed as above ?

## 2022-04-14 NOTE — Assessment & Plan Note (Signed)
>>  ASSESSMENT AND PLAN FOR GAD (GENERALIZED ANXIETY DISORDER) WRITTEN ON 04/14/2022 10:30 AM BY Shown Dissinger B, MD  Elevated GAD-7 We will follow-up in 1 month

## 2022-04-14 NOTE — Assessment & Plan Note (Signed)
Elevated GAD-7 ?We will follow-up in 1 month ?

## 2022-04-14 NOTE — Progress Notes (Signed)
? ?New Patient Office Visit ? ?Subjective   ? ?Patient ID: Miguel Dunn, male    DOB: [birth year]  Age: 26 y.o. MRN: [id number] ? ?CC:  ?Chief Complaint  ?Patient presents with  ? Establish Care  ?  Np want to discuss acne on back. States he has a weird spot on chest  ? ? ?HPI ?Miguel Dunn presents to establish care.  ? ?Has concerns about extensive acne on his back.  He has had recurrent infections of this and is interested in referral to dermatology. ? ?Patient has concerns about mood including anxiety and depression.  No SI or HI today.  He has seen counseling in the past while in college, he has never tried medications though.  Endorse using CBD 1 time, but denies ongoing use or other substances such as excessive alcohol, tobacco, or other illegal drug. ? ? ?Outpatient Encounter Medications as of 04/14/2022  ?Medication Sig  ? [DISCONTINUED] benzonatate (TESSALON) 100 MG capsule Take 1 capsule (100 mg total) by mouth every 8 (eight) hours.  ? [DISCONTINUED] doxycycline (VIBRAMYCIN) 100 MG capsule Take 1 capsule (100 mg total) by mouth 2 (two) times daily.  ? [DISCONTINUED] fluticasone (FLONASE) 50 MCG/ACT nasal spray Place 1 spray into both nostrils daily.  ? [DISCONTINUED] naproxen (NAPROSYN) 375 MG tablet Take 1 tablet (375 mg total) by mouth 2 (two) times daily.  ? ?No facility-administered encounter medications on file as of 04/14/2022.  ? ? ?History reviewed. No pertinent past medical history. ? ?History reviewed. No pertinent surgical history. ? ?History reviewed. No pertinent family history. ? ?Social History  ? ?Socioeconomic History  ? Marital status: Single  ?  Spouse name: Not on file  ? Number of children: Not on file  ? Years of education: Not on file  ? Highest education level: Not on file  ?Occupational History  ? Not on file  ?Tobacco Use  ? Smoking status: Never  ? Smokeless tobacco: Never  ?Substance and Sexual Activity  ? Alcohol use: Not Currently  ?  Alcohol/week: 3.0 standard drinks  ?   Types: 3 Glasses of wine per week  ?  Comment: occ  ? Drug use: Never  ? Sexual activity: Never  ?Other Topics Concern  ? Not on file  ?Social History Narrative  ? Not on file  ? ?Social Determinants of Health  ? ?Financial Resource Strain: Not on file  ?Food Insecurity: Not on file  ?Transportation Needs: Not on file  ?Physical Activity: Not on file  ?Stress: Not on file  ?Social Connections: Not on file  ?Intimate Partner Violence: Not on file  ? ? ?Review of Systems  ?Respiratory:  Negative for cough, shortness of breath and wheezing.   ?Cardiovascular:  Positive for chest pain (happens when moving left arm, hurts in sternum. Goes a way when he rest his arm.). Negative for palpitations, orthopnea, claudication, leg swelling and PND.  ?All other systems reviewed and are negative. ? ?  ? ? ?Objective   ? ?BP 110/80 (BP Location: Left Arm, Patient Position: Sitting, Cuff Size: Normal)   Pulse (!) 57   Temp (!) 97.3 ?F (36.3 ?C) (Temporal)   Ht _0  (1.803 m)   Wt 253 lb 3.2 oz (114.9 kg)   SpO2 96%   BMI 35.31 kg/m?  ? ?Repeat pulse check showed range between high 40s to 50s. ? ?Gen: NAD, resting comfortably ?CV: RRR with no murmurs appreciated ?Pulm: NWOB, CTAB with no crackles, wheezes, or rhonchi ?GI: Normal bowel sounds  present. Soft, Nontender, Nondistended. ?MSK: no edema, cyanosis, or clubbing noted ?Skin: warm, dry, acne vulgaris on back, no signs of active infection, with left shoulder raised the sternum is mildly tender when palpated , this does go away when the shoulder is resting ?Neuro: grossly normal, moves all extremities ?Psych: Normal affect and thought content ? ?  ? ?Assessment & Plan:  ? ?Problem List Items Addressed This Visit   ? ?  ? Musculoskeletal and Integument  ? Acne vulgaris  ?  Extensive, on back  ?Referral to dermatology as requested ? ?  ?  ? Relevant Orders  ? Ambulatory referral to Dermatology  ?  ? Other  ? Bradycardia - Primary  ?  Asymptomatic ?EKG without rhythm  abnormalities, although mildly persistent bradycardia ?Denies any family history of any early cardiac death ?We will work-up infectious, electrolyte, thyroid related issues as associated labs ?Instructed to go to ED if develop any severe cardiac symptoms ? ?  ?  ? Relevant Orders  ? EKG 12-Lead (Completed)  ? Thyroid Panel With TSH  ? Lyme Disease Serology w/Reflex  ? Comp Met (CMET)  ? RPR  ? Depression, major, single episode, moderate ([place])  ?  Significantly elevated ?No SI/HI ?Given extent of bradycardia work-up, discussed with We will follow-up in 1 month ? ?  ?  ? GAD (generalized anxiety disorder)  ?  Elevated GAD-7 ?We will follow-up in 1 month ? ?  ?  ? BMI 35.0-35.9,adult  ?  Given bradycardia ?Coupled with elevated BMI, will check restratification labs ?Counseled on healthy diet and exercise ? ?  ?  ? Relevant Orders  ? Lipid Profile  ? Hemoglobin A1C  ? Chest wall pain  ?  Associated with shoulder elevation and tender to palpation ?Do not suspect that chest pain is related to bradycardia, I believe it is musculoskeletal in nature ?Nevertheless doing work-up for bradycardia and cardiac restratification labs ?Cardiac return precautions discussed as above ? ?  ?  ? ?Other Visit Diagnoses   ? ? Establishing care with new doctor, encounter for      ? ?  ? ? ?Return in about 4 weeks (around 05/12/2022) for Depression and anxiety, bradycardia.  ? ?[name], MD ? ? ?

## 2022-04-14 NOTE — Assessment & Plan Note (Signed)
Extensive, on back  ?Referral to dermatology as requested ?

## 2022-04-14 NOTE — Assessment & Plan Note (Signed)
Significantly elevated ?No SI/HI ?Given extent of bradycardia work-up, discussed with We will follow-up in 1 month ?

## 2022-04-14 NOTE — Patient Instructions (Signed)
We checked an EKG today to assess your heart rate, this was normal other than low heart rate.  ?We are getting some labs to assess this further. ?Given the bradycardia, lets follow-up on depression and anxiety in 1 month ?We have referred you to dermatology for back acne ?If you develop any irregular heart rate, chest pain, shortness of breath or any other worrisome symptom please go to the emergency department or call 911. ?

## 2022-04-14 NOTE — Progress Notes (Signed)
Mildly low cholesterol. But blood sugar and electrolytes are normal

## 2022-04-15 LAB — THYROID PANEL WITH TSH
Free Thyroxine Index: 2.6 (ref 1.4–3.8)
T3 Uptake: 30 % (ref 22–35)
T4, Total: 8.6 ug/dL (ref 4.9–10.5)
TSH: 3.11 mIU/L (ref 0.40–4.50)

## 2022-04-15 LAB — LYME DISEASE SEROLOGY W/REFLEX: Lyme Total Antibody EIA: NEGATIVE

## 2022-04-15 LAB — RPR: RPR Ser Ql: NONREACTIVE

## 2022-04-21 ENCOUNTER — Telehealth: Payer: Self-pay | Admitting: Family Medicine

## 2022-04-21 NOTE — Telephone Encounter (Signed)
Pt is wanting a referral for behavioral health. Please advise pt at [phone] ?

## 2022-04-22 ENCOUNTER — Other Ambulatory Visit: Payer: Self-pay | Admitting: Family Medicine

## 2022-04-22 DIAGNOSIS — F321 Major depressive disorder, single episode, moderate: Secondary | ICD-10-CM

## 2022-04-27 ENCOUNTER — Encounter: Payer: Self-pay | Admitting: Psychology

## 2022-04-28 NOTE — Telephone Encounter (Signed)
Spoke to pt and explained his insurance is limited to a few in network places.  ?However I did refer him to [facility] newest BH location  ? ?[facility] ? ?[phone] ? ?Address: ? ?[address] ? ?[place], [place] [address] ? ?Hours: ? ?Open 24/7, No appointment required. ?

## 2022-04-28 NOTE — Telephone Encounter (Signed)
Pt would like a different referral for a therapist. The referral made will take too long to get appt.  ? ?Pt also requesting tele-visit with therapist ?

## 2022-05-12 ENCOUNTER — Ambulatory Visit: Payer: 59 | Admitting: Family Medicine

## 2022-10-20 ENCOUNTER — Encounter: Payer: Commercial Managed Care - HMO | Admitting: Psychology

## 2022-11-30 ENCOUNTER — Telehealth: Payer: Self-pay | Admitting: Family Medicine

## 2022-11-30 NOTE — Telephone Encounter (Signed)
error 

## 2022-12-09 ENCOUNTER — Ambulatory Visit: Payer: Commercial Managed Care - HMO | Admitting: Family Medicine

## 2022-12-15 ENCOUNTER — Encounter: Payer: Self-pay | Admitting: Family Medicine

## 2022-12-15 ENCOUNTER — Ambulatory Visit (INDEPENDENT_AMBULATORY_CARE_PROVIDER_SITE_OTHER): Payer: Medicaid Other | Admitting: Family Medicine

## 2022-12-15 VITALS — BP 120/76 | HR 72 | Temp 97.7°F | Wt 228.8 lb

## 2022-12-15 DIAGNOSIS — F322 Major depressive disorder, single episode, severe without psychotic features: Secondary | ICD-10-CM | POA: Diagnosis not present

## 2022-12-15 DIAGNOSIS — R21 Rash and other nonspecific skin eruption: Secondary | ICD-10-CM | POA: Diagnosis not present

## 2022-12-15 DIAGNOSIS — F410 Panic disorder [episodic paroxysmal anxiety] without agoraphobia: Secondary | ICD-10-CM | POA: Diagnosis not present

## 2022-12-15 DIAGNOSIS — L700 Acne vulgaris: Secondary | ICD-10-CM

## 2022-12-15 MED ORDER — ESCITALOPRAM OXALATE 10 MG PO TABS: ORAL_TABLET | ORAL | 0 refills | Status: DC

## 2022-12-15 NOTE — Progress Notes (Signed)
Assessment/Plan:   Problem List Items Addressed This Visit       Musculoskeletal and Integument   Acne vulgaris    Unable to be addressed due to time limitations; to be assessed by dermatology at a future visit.      Relevant Orders   Ambulatory referral to Dermatology   Rash    Monitor the rash, which may be associated with plasma donation, and follow-up if symptoms worsen or do not resolve.      Relevant Orders   Ambulatory referral to Dermatology     Other   Panic anxiety syndrome - Primary    The patient presents with frequent panic attacks and consistent anxiety that is impacting daily life significantly. The history of the presenting illness suggests a persistent and high level of anxiety that is not adequately managed by current therapeutic measures.  Differential diagnosis:  Panic disorder Generalized anxiety disorder Anxiety due to medical condition Panic disorder and generalized anxiety disorder are both likely given the patient's description of symptoms, including panic attacks, chest pain associated with anxiety, and constant worrying. Anxiety due to a medical condition is less likely as there's no indication of a specific medical problem causing the anxiety; however, this possibility should not be entirely ruled out without a thorough assessment.  Plan: Considering the severity of the symptoms and the significant impact on the patient's quality of life, a two-pronged approach of pharmacotherapy followed by psychotherapy is recommended. Escitalopram (Lexapro) is chosen for its efficacy in treating both anxiety and depression and its relatively favorable side-effect profile. The patient should start on a lower dose of 5 mg to minimize potential side effects and then after one week, assuming good tolerance, increase to 10 mg daily, which is a standard therapeutic dose for anxiety and depression. The plan includes a follow-up appointment in about a month to reassess the  effectiveness of the medication, tolerability, and to adjust the treatment plan as necessary. Continuing therapy is encouraged alongside medication to maximize treatment effectiveness.      Relevant Medications   escitalopram (LEXAPRO) 10 MG tablet   Other Visit Diagnoses     Current severe episode of major depressive disorder without psychotic features, unspecified whether recurrent (HCC)       Relevant Medications   escitalopram (LEXAPRO) 10 MG tablet          Subjective:  HPI: Subjective: HPI: Miguel Dunn is a 27 year old male who presents with an established diagnosis of both panic disorder and generalized anxiety disorder. He also has concerns regarding acne vulgaris and chest pain attributed to anxiety. There are also new skin concerns on his chest that have been present for approximating two weeks.  CHIEF COMPLAINT: The patient's primary concerns are panic attacks, persistent anxiety, acne on the upper back, and new skin lesions on the chest.  HISTORY OF PRESENT ILLNESS:  Problem 1: Miguel Dunn reports experiencing panic attacks and heightened anxiety, describing it as consistent and more intense than before. He notes chest pain during these episodes, which he attributes to his anxiety rather than a cardiac event. There is also an established diagnosis of major depressive disorder represented by a high PHQ-9 score. The patient has a history of brief therapy intervention with planned but not initiated pharmacotherapy.  Problem 2: Additionally, Miguel Dunn has ongoing concerns about acne vulgaris on his back and new skin spots on his chest. He has had no previous dermatological consultations or treatments for his skin concerns.  REVIEW OF SYSTEMS: Reports  increased anxiety with physical manifestations such as sweating, restlessness, and breathing difficulties. He also has trouble with sleep onset and maintenance due to anxiety.     12/15/2022    3:12 PM 04/14/2022   10:10  AM 04/14/2022    9:44 AM  Depression screen PHQ 2/9  Decreased Interest 3 3 3   Down, Depressed, Hopeless 3 3 3   PHQ - 2 Score 6 6 6   Altered sleeping 3 2   Tired, decreased energy 3 3   Change in appetite 1 1   Feeling bad or failure about yourself  3 3   Trouble concentrating 3 1   Moving slowly or fidgety/restless 3 0   Suicidal thoughts 0 0   PHQ-9 Score 22 16   Difficult doing work/chores Extremely dIfficult Very difficult        12/15/2022    3:12 PM  GAD 7 : Generalized Anxiety Score  Nervous, Anxious, on Edge 3  Control/stop worrying 3  Worry too much - different things 3  Trouble relaxing 3  Restless 2  Easily annoyed or irritable 3  Afraid - awful might happen 3  Total GAD 7 Score 20  Anxiety Difficulty Extremely difficult    ROS: No SI or HI.   No past surgical history on file.  No outpatient medications prior to visit.   No facility-administered medications prior to visit.    No family history on file.  Social History   Socioeconomic History   Marital status: Single    Spouse name: Not on file   Number of children: Not on file   Years of education: Not on file   Highest education level: Not on file  Occupational History   Not on file  Tobacco Use   Smoking status: Never   Smokeless tobacco: Never  Substance and Sexual Activity   Alcohol use: Not Currently    Alcohol/week: 3.0 standard drinks of alcohol    Types: 3 Glasses of wine per week    Comment: occ   Drug use: Never   Sexual activity: Never  Other Topics Concern   Not on file  Social History Narrative   Not on file   Social Determinants of Health   Financial Resource Strain: Not on file  Food Insecurity: Not on file  Transportation Needs: Not on file  Physical Activity: Not on file  Stress: Not on file  Social Connections: Not on file  Intimate Partner Violence: Not on file                                                                                                  Objective:  Physical Exam: BP 120/76 (BP Location: Left Arm, Patient Position: Sitting, Cuff Size: Large)   Pulse 72   Temp 97.7 F (36.5 C) (Temporal)   Wt 228 lb 12.8 oz (103.8 kg)   SpO2 99%   BMI 31.91 kg/m    General: No acute distress. Awake and conversant.  Eyes: Normal conjunctiva, anicteric. Round symmetric pupils.  ENT: Hearing grossly intact. No nasal discharge.  Neck: Neck is supple. No masses or  thyromegaly.  Respiratory: Respirations are non-labored. No auditory wheezing.  Skin: Warm.  On the chest there is a few scattered patches macules that are mildly hyperpigmented, they are nontender, there is extensive complaints across patient's back psych: Alert and oriented. Cooperative, Appropriate mood and affect, Normal judgment.  CV: No cyanosis or JVD MSK: Normal ambulation. No clubbing  Neuro: Sensation and CN II-XII grossly normal.               Alesia Banda, MD, MS

## 2022-12-15 NOTE — Patient Instructions (Signed)
Start escitalopram for anxiety and depression.   We are referring to dermatology for rash and back acne

## 2022-12-20 DIAGNOSIS — F410 Panic disorder [episodic paroxysmal anxiety] without agoraphobia: Secondary | ICD-10-CM | POA: Insufficient documentation

## 2022-12-20 DIAGNOSIS — R21 Rash and other nonspecific skin eruption: Secondary | ICD-10-CM | POA: Insufficient documentation

## 2022-12-20 NOTE — Assessment & Plan Note (Signed)
Unable to be addressed due to time limitations; to be assessed by dermatology at a future visit.

## 2022-12-20 NOTE — Assessment & Plan Note (Signed)
Monitor the rash, which may be associated with plasma donation, and follow-up if symptoms worsen or do not resolve.

## 2022-12-20 NOTE — Assessment & Plan Note (Addendum)
The patient presents with frequent panic attacks and consistent anxiety that is impacting daily life significantly. The history of the presenting illness suggests a persistent and high level of anxiety that is not adequately managed by current therapeutic measures.  Differential diagnosis:  Panic disorder Generalized anxiety disorder Anxiety due to medical condition Panic disorder and generalized anxiety disorder are both likely given the patient's description of symptoms, including panic attacks, chest pain associated with anxiety, and constant worrying. Anxiety due to a medical condition is less likely as there's no indication of a specific medical problem causing the anxiety; however, this possibility should not be entirely ruled out without a thorough assessment.  Plan: Considering the severity of the symptoms and the significant impact on the patient's quality of life, a two-pronged approach of pharmacotherapy followed by psychotherapy is recommended. Escitalopram (Lexapro) is chosen for its efficacy in treating both anxiety and depression and its relatively favorable side-effect profile. The patient should start on a lower dose of 5 mg to minimize potential side effects and then after one week, assuming good tolerance, increase to 10 mg daily, which is a standard therapeutic dose for anxiety and depression. The plan includes a follow-up appointment in about a month to reassess the effectiveness of the medication, tolerability, and to adjust the treatment plan as necessary. Continuing therapy is encouraged alongside medication to maximize treatment effectiveness.

## 2023-01-11 ENCOUNTER — Telehealth (INDEPENDENT_AMBULATORY_CARE_PROVIDER_SITE_OTHER): Payer: Medicaid Other | Admitting: Family Medicine

## 2023-01-11 DIAGNOSIS — F322 Major depressive disorder, single episode, severe without psychotic features: Secondary | ICD-10-CM

## 2023-01-11 DIAGNOSIS — L700 Acne vulgaris: Secondary | ICD-10-CM | POA: Diagnosis not present

## 2023-01-11 DIAGNOSIS — F410 Panic disorder [episodic paroxysmal anxiety] without agoraphobia: Secondary | ICD-10-CM | POA: Diagnosis not present

## 2023-01-11 MED ORDER — ESCITALOPRAM OXALATE 10 MG PO TABS: ORAL_TABLET | ORAL | 0 refills | Status: DC

## 2023-01-11 NOTE — Progress Notes (Signed)
Virtual Visit via Video Note  I connected with Miguel Dunn on 01/11/2023 at 10:00 AM EST by a video enabled telemedicine application and verified that I am speaking with the correct person using two identifiers.  Location: Patient: Home Provider: office   I discussed the limitations of evaluation and management by telemedicine and the availability of in person appointments. The patient expressed understanding and agreed to proceed.  History of Present Illness:   Problem 1: Anxiety The patient is a 27 year old male with a history of anxiety and depression, presenting for a follow-up. A month ago, his PHQ9 was a concerning 20, but it has since improved to 15, indicating mild improvement in depressive symptoms. He reports suffering from anxiety with panic attacks, which seemed extremely difficult to manage in earlier assessments; however, today, he relays that the difficulty has decreased to "somewhat difficult." The patient was initially prescribed Lexapro 5mg , which was titrated up to 10mg . His anxiety is somewhat controlled, but he still experiences symptoms and is seeking further treatment optimization.  Problem 2: Depression The patient's depression was previously scored as severe on the PHQ9 scale, but through treatment, he's demonstrated progress with a lower score of 15. His escitalopram dose has been increased, and while this has improved his mood, there are mild side effects like sleepiness, which he manages with coffee.  Review of Systems: Positive findings:  Psychological: Reports improvement in anxiety and depression symptoms but still experiences anxiety. Negative findings: Constitutional: No fever, no weight loss. HEENT: No vision changes, no hearing loss. Cardiovascular: No chest pain, no palpitations. Respiratory: No shortness of breath, no cough. Gastrointestinal: No abdominal pain, no diarrhea. Genitourinary: No dysuria, no frequency. Musculoskeletal: No muscle  weakness, no joint pain. All other systems are negative.  @    01/11/2023    9:47 AM 12/15/2022    3:12 PM 04/14/2022   10:10 AM  Depression screen PHQ 2/9  Decreased Interest 2 3 3   Down, Depressed, Hopeless 2 3 3   PHQ - 2 Score 4 6 6   Altered sleeping 3 3 2   Tired, decreased energy 3 3 3   Change in appetite 0 1 1  Feeling bad or failure about yourself  3 3 3   Trouble concentrating 2 3 1   Moving slowly or fidgety/restless 0 3 0  Suicidal thoughts 0 0 0  PHQ-9 Score 15 22 16   Difficult doing work/chores Extremely dIfficult Extremely dIfficult Very difficult       01/11/2023    9:48 AM  GAD 7 : Generalized Anxiety Score  Nervous, Anxious, on Edge 1  Control/stop worrying 2  Worry too much - different things 3  Trouble relaxing 1  Restless 0  Easily annoyed or irritable 2  Afraid - awful might happen 3  Total GAD 7 Score 12  Anxiety Difficulty Somewhat difficult    No SI or HI.    Observations/Objective: There were no vitals filed for this visit.   Gen: NAD, resting comfortably HEENT: EOMI Pulm: NWOB Skin: no rash on face Neuro: no facial asymmetry or dysmetria Psych: Normal affect   Assessment and Plan: Problem List Items Addressed This Visit       Musculoskeletal and Integument   Acne vulgaris - Primary   Relevant Orders   Ambulatory referral to Dermatology     Other   Panic anxiety syndrome    The patient's current symptoms suggest a partial response to escitalopram. The reduction from a GAD-7 score of 20 to 12 is encouraging, though anxiety continues  to be a significant concern.  Plan: Given the patient's partial response to escitalopram 10mg , with tolerable side effects, the plan is to titrate the medication upwards carefully. Consequent discussions suggest starting with a dose increase to 15mg  (1.5 tablets of the 10mg ) for a couple of weeks before re-evaluating for a further increase to the 20mg  dose if well tolerated. Escitalopram has a ceiling dose of  20mg , often necessary for full therapeutic effect in anxiety disorders, and a higher dose could potentially provide enhanced control over anxiety symptoms. The onset after starting escitalopram is typically noted within 4 weeks, and ongoing improvement can be expected. We will also discuss the role of adjunct therapy in future visits if symptoms remain uncontrolled, though the patient understands the potential for side effects with additional medications.      Relevant Medications   escitalopram (LEXAPRO) 10 MG tablet   Other Visit Diagnoses     Current severe episode of major depressive disorder without psychotic features, unspecified whether recurrent ([place])       Relevant Medications   escitalopram (LEXAPRO) 10 MG tablet       Medications Discontinued During This Encounter  Medication Reason   escitalopram (LEXAPRO) 10 MG tablet Reorder     Follow Up Instructions: No follow-ups on file.    I discussed the assessment and treatment plan with the patient. The patient was provided an opportunity to ask questions and all were answered. The patient agreed with the plan and demonstrated an understanding of the instructions.   The patient was advised to call back or seek an in-person evaluation if the symptoms worsen or if the condition fails to improve as anticipated.  I provided 30 minutes of non-face-to-face time during this encounter.   [name], MD

## 2023-01-11 NOTE — Assessment & Plan Note (Signed)
The patient's current symptoms suggest a partial response to escitalopram. The reduction from a GAD-7 score of 20 to 12 is encouraging, though anxiety continues to be a significant concern.  Plan: Given the patient's partial response to escitalopram 10mg , with tolerable side effects, the plan is to titrate the medication upwards carefully. Consequent discussions suggest starting with a dose increase to 15mg  (1.5 tablets of the 10mg ) for a couple of weeks before re-evaluating for a further increase to the 20mg  dose if well tolerated. Escitalopram has a ceiling dose of 20mg , often necessary for full therapeutic effect in anxiety disorders, and a higher dose could potentially provide enhanced control over anxiety symptoms. The onset after starting escitalopram is typically noted within 4 weeks, and ongoing improvement can be expected. We will also discuss the role of adjunct therapy in future visits if symptoms remain uncontrolled, though the patient understands the potential for side effects with additional medications.

## 2023-01-31 ENCOUNTER — Other Ambulatory Visit: Payer: Self-pay | Admitting: Family Medicine

## 2023-01-31 DIAGNOSIS — F410 Panic disorder [episodic paroxysmal anxiety] without agoraphobia: Secondary | ICD-10-CM

## 2023-01-31 DIAGNOSIS — F322 Major depressive disorder, single episode, severe without psychotic features: Secondary | ICD-10-CM

## 2023-02-02 ENCOUNTER — Other Ambulatory Visit: Payer: Self-pay | Admitting: Family Medicine

## 2023-02-02 DIAGNOSIS — F322 Major depressive disorder, single episode, severe without psychotic features: Secondary | ICD-10-CM

## 2023-02-02 DIAGNOSIS — F410 Panic disorder [episodic paroxysmal anxiety] without agoraphobia: Secondary | ICD-10-CM

## 2023-02-02 MED ORDER — ESCITALOPRAM OXALATE 20 MG PO TABS: 20.0000 mg | ORAL_TABLET | Freq: Every day | ORAL | 0 refills | Status: DC

## 2023-03-29 ENCOUNTER — Telehealth: Payer: Self-pay | Admitting: Family Medicine

## 2023-03-29 DIAGNOSIS — F410 Panic disorder [episodic paroxysmal anxiety] without agoraphobia: Secondary | ICD-10-CM

## 2023-03-29 DIAGNOSIS — F322 Major depressive disorder, single episode, severe without psychotic features: Secondary | ICD-10-CM

## 2023-03-29 MED ORDER — ESCITALOPRAM OXALATE 20 MG PO TABS: 20.0000 mg | ORAL_TABLET | Freq: Every day | ORAL | 0 refills | Status: DC

## 2023-03-29 NOTE — Telephone Encounter (Signed)
Caller Name: Montario Call back phone #: [phone]   MEDICATION(S):  Lexapro 20 mg

## 2023-03-29 NOTE — Telephone Encounter (Signed)
Chart supports rx. Last OV: 01/11/2023

## 2023-05-03 ENCOUNTER — Other Ambulatory Visit: Payer: Self-pay | Admitting: Family Medicine

## 2023-05-03 DIAGNOSIS — F410 Panic disorder [episodic paroxysmal anxiety] without agoraphobia: Secondary | ICD-10-CM

## 2023-05-03 DIAGNOSIS — F322 Major depressive disorder, single episode, severe without psychotic features: Secondary | ICD-10-CM

## 2023-05-03 NOTE — Telephone Encounter (Signed)
Chart supports rx. Last OV: 01/11/2023 Next OV: 05/20/2023

## 2023-05-20 ENCOUNTER — Ambulatory Visit (INDEPENDENT_AMBULATORY_CARE_PROVIDER_SITE_OTHER): Payer: Medicaid Other | Admitting: Family Medicine

## 2023-05-20 ENCOUNTER — Encounter: Payer: Self-pay | Admitting: Family Medicine

## 2023-05-20 VITALS — BP 116/76 | HR 70 | Temp 97.7°F | Ht 71.0 in | Wt 227.4 lb

## 2023-05-20 DIAGNOSIS — R5383 Other fatigue: Secondary | ICD-10-CM | POA: Diagnosis not present

## 2023-05-20 DIAGNOSIS — F411 Generalized anxiety disorder: Secondary | ICD-10-CM

## 2023-05-20 DIAGNOSIS — F321 Major depressive disorder, single episode, moderate: Secondary | ICD-10-CM | POA: Diagnosis not present

## 2023-05-20 DIAGNOSIS — E669 Obesity, unspecified: Secondary | ICD-10-CM | POA: Diagnosis not present

## 2023-05-20 DIAGNOSIS — E66811 Obesity, class 1: Secondary | ICD-10-CM

## 2023-05-20 DIAGNOSIS — Z0000 Encounter for general adult medical examination without abnormal findings: Secondary | ICD-10-CM | POA: Diagnosis not present

## 2023-05-20 DIAGNOSIS — Z13220 Encounter for screening for lipoid disorders: Secondary | ICD-10-CM

## 2023-05-20 DIAGNOSIS — Z1159 Encounter for screening for other viral diseases: Secondary | ICD-10-CM

## 2023-05-20 LAB — URINALYSIS, ROUTINE W REFLEX MICROSCOPIC
Bilirubin Urine: NEGATIVE
Hgb urine dipstick: NEGATIVE
Ketones, ur: NEGATIVE
Leukocytes,Ua: NEGATIVE
Nitrite: NEGATIVE
RBC / HPF: NONE SEEN (ref 0–?)
Specific Gravity, Urine: >=1.03 — AB (ref 1.000–1.030)
Total Protein, Urine: NEGATIVE
Urine Glucose: NEGATIVE
Urobilinogen, UA: 0.2 (ref 0.0–1.0)
WBC, UA: NONE SEEN (ref 0–?)
pH: 6 (ref 5.0–8.0)

## 2023-05-20 LAB — CBC WITH DIFFERENTIAL/PLATELET
Basophils Absolute: 0 10*3/uL (ref 0.0–0.1)
Basophils Relative: 0.3 % (ref 0.0–3.0)
Eosinophils Absolute: 0.1 10*3/uL (ref 0.0–0.7)
Eosinophils Relative: 1.9 % (ref 0.0–5.0)
HCT: 45.4 % (ref 39.0–52.0)
Hemoglobin: 15.3 g/dL (ref 13.0–17.0)
Lymphocytes Relative: 29.9 % (ref 12.0–46.0)
Lymphs Abs: 2 10*3/uL (ref 0.7–4.0)
MCHC: 33.6 g/dL (ref 30.0–36.0)
MCV: 85.9 fl (ref 78.0–100.0)
Monocytes Absolute: 0.5 10*3/uL (ref 0.1–1.0)
Monocytes Relative: 7.4 % (ref 3.0–12.0)
Neutro Abs: 4.1 10*3/uL (ref 1.4–7.7)
Neutrophils Relative %: 60.5 % (ref 43.0–77.0)
Platelets: 206 10*3/uL (ref 150.0–400.0)
RBC: 5.28 Mil/uL (ref 4.22–5.81)
RDW: 13.2 % (ref 11.5–15.5)
WBC: 6.8 10*3/uL (ref 4.0–10.5)

## 2023-05-20 LAB — HEMOGLOBIN A1C: Hgb A1c MFr Bld: 4.9 % (ref 4.6–6.5)

## 2023-05-20 LAB — COMPREHENSIVE METABOLIC PANEL
ALT: 12 U/L (ref 0–53)
AST: 13 U/L (ref 0–37)
Albumin: 4.6 g/dL (ref 3.5–5.2)
Alkaline Phosphatase: 46 U/L (ref 39–117)
BUN: 20 mg/dL (ref 6–23)
CO2: 26 mEq/L (ref 19–32)
Calcium: 9.3 mg/dL (ref 8.4–10.5)
Chloride: 101 mEq/L (ref 96–112)
Creatinine, Ser: 1.06 mg/dL (ref 0.40–1.50)
GFR: 97.12 mL/min (ref >60.00)
Glucose, Bld: 79 mg/dL (ref 70–99)
Potassium: 4.2 mEq/L (ref 3.5–5.1)
Sodium: 137 mEq/L (ref 135–145)
Total Bilirubin: 0.6 mg/dL (ref 0.2–1.2)
Total Protein: 7.1 g/dL (ref 6.0–8.3)

## 2023-05-20 LAB — LIPID PANEL
Cholesterol: 170 mg/dL (ref 0–200)
HDL: 45.4 mg/dL (ref >39.00)
LDL Cholesterol: 109 mg/dL — ABNORMAL HIGH (ref 0–99)
NonHDL: 124.78
Total CHOL/HDL Ratio: 4
Triglycerides: 77 mg/dL (ref 0.0–149.0)
VLDL: 15.4 mg/dL (ref 0.0–40.0)

## 2023-05-20 LAB — MICROALBUMIN / CREATININE URINE RATIO
Creatinine,U: 191.1 mg/dL
Microalb Creat Ratio: 0.4 mg/g (ref 0.0–30.0)
Microalb, Ur: <0.7 mg/dL (ref 0.0–1.9)

## 2023-05-20 LAB — C-REACTIVE PROTEIN: CRP: <1 mg/dL (ref 0.5–20.0)

## 2023-05-20 LAB — TSH: TSH: 2.37 u[IU]/mL (ref 0.35–5.50)

## 2023-05-20 LAB — SEDIMENTATION RATE: Sed Rate: 19 mm/hr — ABNORMAL HIGH (ref 0–15)

## 2023-05-20 MED ORDER — BUPROPION HCL ER (XL) 150 MG PO TB24: 150.0000 mg | ORAL_TABLET | Freq: Every day | ORAL | 0 refills | Status: DC

## 2023-05-20 NOTE — Assessment & Plan Note (Signed)
25-pound weight loss over the past year, intentional through dietary changes. Action: Continue current approach with diet. Encourage regular exercise to support overall mood and physical health. Restratification labs ordered.

## 2023-05-20 NOTE — Assessment & Plan Note (Signed)
Patient reports improvement in anxiety symptoms with current medication (escitalopram), but ongoing issues with depression. Continue current escitalopram 20 mg. Initiate bupropion 150mg  XL for its adjunctive effects on depression. Monitor for any increase in anxiety or change in depression symptoms.

## 2023-05-20 NOTE — Assessment & Plan Note (Signed)
>>  ASSESSMENT AND PLAN FOR GAD (GENERALIZED ANXIETY DISORDER) WRITTEN ON 05/20/2023 12:57 PM BY Fanny Bien B, MD  Patient reports improvement in anxiety symptoms with current medication (escitalopram), but ongoing issues with depression. Continue current escitalopram 20 mg. Initiate bupropion 150mg  XL for its adjunctive effects on depression. Monitor for any increase in anxiety or change in depression symptoms.

## 2023-05-20 NOTE — Patient Instructions (Addendum)
Take your newly prescribed medication (Bupropion 150mg  extended-release) as directed and monitor for any side effects, especially in the first few weeks. Continue taking Escitalopram 20mg  daily and increase its dosage if instructed by your healthcare provider. Schedule and attend a dermatological evaluation as the referral will be processed. Incorporate regular exercise and maintain a balanced diet to help manage both anxiety and depression.

## 2023-05-20 NOTE — Assessment & Plan Note (Signed)
Likely related to depression, however given postprandial symptoms will broaden by assessing for metabolic, endocrine, nutritional deficiencies as ordered

## 2023-05-20 NOTE — Progress Notes (Signed)
Assessment  Assessment/Plan:   Problem List Items Addressed This Visit       Other   Depression, major, single episode, moderate (HCC) - Primary   Relevant Medications   buPROPion (WELLBUTRIN XL) 150 MG 24 hr tablet   GAD (generalized anxiety disorder)    Patient reports improvement in anxiety symptoms with current medication (escitalopram), but ongoing issues with depression. Continue current escitalopram 20 mg. Initiate bupropion 150mg  XL for its adjunctive effects on depression. Monitor for any increase in anxiety or change in depression symptoms.      Relevant Medications   buPROPion (WELLBUTRIN XL) 150 MG 24 hr tablet   Class 1 obesity without serious comorbidity in adult    25-pound weight loss over the past year, intentional through dietary changes. Action: Continue current approach with diet. Encourage regular exercise to support overall mood and physical health. Restratification labs ordered.      Relevant Orders   TSH   Lipid panel   Hemoglobin A1c   Microalbumin / creatinine urine ratio   Urinalysis, Routine w reflex microscopic   Vitamin D 1,25 dihydroxy   CBC with Differential/Platelet   Comprehensive metabolic panel   Sedimentation rate   C-reactive protein   Iron, TIBC and Ferritin Panel   Other fatigue    Likely related to depression, however given postprandial symptoms will broaden by assessing for metabolic, endocrine, nutritional deficiencies as ordered      Relevant Orders   TSH   Lipid panel   Hemoglobin A1c   Microalbumin / creatinine urine ratio   Urinalysis, Routine w reflex microscopic   Vitamin D 1,25 dihydroxy   CBC with Differential/Platelet   Comprehensive metabolic panel   Sedimentation rate   C-reactive protein   Iron, TIBC and Ferritin Panel   Other Visit Diagnoses     Encounter for well adult exam without abnormal findings       Screening for viral disease       Relevant Orders   Hepatitis C Antibody   Hepatitis B core  antibody, total   Hepatitis B surface antibody,qualitative   Hepatitis B surface antigen   HIV antibody (with reflex)       There are no discontinued medications.  Patient Counseling(The following topics were reviewed and/or handout was given):  -Nutrition: Stressed importance of moderation in sodium/caffeine intake, saturated fat and cholesterol, caloric balance, sufficient intake of fresh fruits, vegetables, and fiber.  -Stressed the importance of regular exercise.   -Substance Abuse: Discussed cessation/primary prevention of tobacco, alcohol, or other drug use; driving or other dangerous activities under the influence; availability of treatment for abuse.   -Injury prevention: Discussed safety belts, safety helmets, smoke detector, smoking near bedding or upholstery.   -Sexuality: Discussed sexually transmitted diseases, partner selection, use of condoms, avoidance of unintended pregnancy and contraceptive alternatives.   -Dental health: Discussed importance of regular tooth brushing, flossing, and dental visits.  -Health maintenance and immunizations reviewed. Please refer to Health maintenance section.  Return to care in 1 year for next preventative visit.       Subjective:  Chief complaint Encounter date: 05/20/2023  Chief Complaint  Patient presents with   Annual Exam    Fasting. Fatigued after eating sugar.    Miguel Dunn is a 27 y.o. male who presents today for his annual comprehensive physical exam.    Depression and anxiety: The patient reports experiencing persistent depression despite improvement in anxiety with escitalopram. No thoughts of self-harm or harm to others were  noted. The patient is curious about bupropion, a medication their mother uses successfully.   Fatigue.  Patient reports ongoing fatigue also expresses concerns about low blood sugar episodes, occasionally feeling fatigued after eating.  Acne.  Reports mildly improved acne with weight loss.   Was unable to follow-up with dermatology referral due to recent stressful life events.  Acne is predominantly on the back.  Lifestyle:  Diet: Improved significantly, less red meat, minimal fast food. Exercise: Regular walking, encouraged to increase physical activity.  Review of Systems  Constitutional:  Positive for malaise/fatigue and weight loss (Intentional). Negative for chills, diaphoresis and fever.  HENT:  Negative for congestion, ear discharge, ear pain and hearing loss.   Eyes:  Negative for blurred vision, double vision, photophobia, pain, discharge and redness.  Respiratory:  Negative for cough, sputum production, shortness of breath and wheezing.   Cardiovascular:  Negative for chest pain and palpitations.  Gastrointestinal:  Negative for abdominal pain, blood in stool, constipation, diarrhea, heartburn, melena, nausea and vomiting.  Genitourinary:  Negative for dysuria, flank pain, frequency, hematuria and urgency.  Musculoskeletal:  Negative for myalgias.  Skin:  Negative for itching and rash.  Neurological:  Negative for dizziness, tingling, tremors, speech change, seizures, loss of consciousness, weakness and headaches.  Endo/Heme/Allergies:  Negative for polydipsia.  Psychiatric/Behavioral:  Positive for depression. Negative for hallucinations, memory loss, substance abuse and suicidal ideas. The patient is nervous/anxious. The patient does not have insomnia.   All other systems reviewed and are negative.      05/20/2023    9:47 AM 01/11/2023    9:48 AM 12/15/2022    3:12 PM 04/14/2022   10:11 AM  GAD-7 Generalized Anxiety Disorder Screening Tool  1. Feeling Nervous, Anxious, or on Edge 1 1 3 2   2. Not Being Able to Stop or Control Worrying 1 2 3 2   3. Worrying Too Much About Different Things 1 3 3 3   4. Trouble Relaxing 1 1 3 2   5. Being So Restless it's Hard To Sit Still 0 0 2 0  6. Becoming Easily Annoyed or Irritable 2 2 3 3   7. Feeling Afraid As If Something Awful  Might Happen 2 3 3 3   Total GAD-7 Score 8 12 20 15   Difficulty At Work, Home, or Getting  Along With Others? Somewhat difficult Somewhat difficult Extremely difficult Very difficult      05/20/2023    9:47 AM 01/11/2023    9:47 AM 12/15/2022    3:12 PM 04/14/2022   10:10 AM 04/14/2022    9:44 AM  Depression screen PHQ 2/9  Decreased Interest 3 2 3 3 3   Down, Depressed, Hopeless 3 2 3 3 3   PHQ - 2 Score 6 4 6 6 6   Altered sleeping 2 3 3 2    Tired, decreased energy 3 3 3 3    Change in appetite 1 0 1 1   Feeling bad or failure about yourself  3 3 3 3    Trouble concentrating 3 2 3 1    Moving slowly or fidgety/restless 0 0 3 0   Suicidal thoughts 0 0 0 0   PHQ-9 Score 18 15 22 16    Difficult doing work/chores Extremely dIfficult Extremely dIfficult Extremely dIfficult Very difficult     Health Maintenance Due  Topic Date Due   HIV Screening  Never done   Hepatitis C Screening  Never done     Dental: Has not been in 1 year, recommend follow-up with dentistry Vision: Has  not been in assessed 1 year, recommend follow-up with optometry PMH:  The following were reviewed and entered/updated in epic: Past Medical History:  Diagnosis Date   Anxiety    Depression     Patient Active Problem List   Diagnosis Date Noted   Class 1 obesity without serious comorbidity in adult 05/20/2023   Other fatigue 05/20/2023   Panic anxiety syndrome 12/20/2022   Depression, major, single episode, moderate (HCC) 04/14/2022   Acne vulgaris 04/14/2022   GAD (generalized anxiety disorder) 04/14/2022    No past surgical history on file.  No family history on file.  Medications- reviewed and updated Outpatient Medications Prior to Visit  Medication Sig Dispense Refill   escitalopram (LEXAPRO) 20 MG tablet TAKE 1 TABLET BY MOUTH EVERY DAY 30 tablet 0   No facility-administered medications prior to visit.    Allergies  Allergen Reactions   Aspirin Hives   Augmentin [Amoxicillin-Pot Clavulanate]      Social History   Socioeconomic History   Marital status: Single    Spouse name: Not on file   Number of children: Not on file   Years of education: Not on file   Highest education level: Not on file  Occupational History   Not on file  Tobacco Use   Smoking status: Never   Smokeless tobacco: Never  Substance and Sexual Activity   Alcohol use: Not Currently    Comment: occ   Drug use: Never   Sexual activity: Never  Other Topics Concern   Not on file  Social History Narrative   Not on file   Social Determinants of Health   Financial Resource Strain: Not on file  Food Insecurity: Not on file  Transportation Needs: Not on file  Physical Activity: Not on file  Stress: Not on file  Social Connections: Not on file        Objective:  Physical Exam: BP 116/76 (BP Location: Left Arm, Patient Position: Sitting, Cuff Size: Large)   Pulse 70   Temp 97.7 F (36.5 C) (Temporal)   Ht 5\' 11"  (1.803 m)   Wt 227 lb 6.4 oz (103.1 kg)   SpO2 100%   BMI 31.72 kg/m   Body mass index is 31.72 kg/m. Wt Readings from Last 3 Encounters:  05/20/23 227 lb 6.4 oz (103.1 kg)  12/15/22 228 lb 12.8 oz (103.8 kg)  04/14/22 253 lb 3.2 oz (114.9 kg)    Physical Exam Constitutional:      General: He is not in acute distress.    Appearance: Normal appearance. He is not ill-appearing or toxic-appearing.  HENT:     Head: Normocephalic and atraumatic.     Right Ear: Hearing, tympanic membrane, ear canal and external ear normal. There is no impacted cerumen.     Left Ear: Hearing, tympanic membrane, ear canal and external ear normal. There is no impacted cerumen.     Nose: Nose normal. No congestion.     Mouth/Throat:     Lips: No lesions.     Mouth: Mucous membranes are moist.     Pharynx: Oropharynx is clear. No oropharyngeal exudate.  Eyes:     General: No scleral icterus.       Right eye: No discharge.        Left eye: No discharge.     Conjunctiva/sclera: Conjunctivae normal.      Pupils: Pupils are equal, round, and reactive to light.  Neck:     Thyroid: No thyroid mass, thyromegaly or thyroid  tenderness.  Cardiovascular:     Rate and Rhythm: Normal rate and regular rhythm.     Pulses: Normal pulses.     Heart sounds: Normal heart sounds.  Pulmonary:     Effort: Pulmonary effort is normal. No respiratory distress.     Breath sounds: Normal breath sounds.  Abdominal:     General: Abdomen is flat. Bowel sounds are normal.     Palpations: Abdomen is soft.  Musculoskeletal:        General: Normal range of motion.     Cervical back: Normal range of motion.     Right lower leg: No edema.     Left lower leg: No edema.  Lymphadenopathy:     Cervical: No cervical adenopathy.  Skin:    General: Skin is warm and dry.     Findings: Acne (Scattered, acne lesions on face and back) present.  Neurological:     General: No focal deficit present.     Mental Status: He is alert and oriented to person, place, and time. Mental status is at baseline.     Deep Tendon Reflexes:     Reflex Scores:      Patellar reflexes are 2+ on the right side and 2+ on the left side. Psychiatric:        Mood and Affect: Mood normal.        Behavior: Behavior normal.        Thought Content: Thought content normal.        Judgment: Judgment normal.          Lab Results  Component Value Date   CHOL 162 04/14/2022   HDL 35.90 (L) 04/14/2022   LDLCALC 99 04/14/2022   TRIG 132.0 04/14/2022   CHOLHDL 5 04/14/2022   Last metabolic panel Lab Results  Component Value Date   GLUCOSE 85 04/14/2022   NA 138 04/14/2022   K 4.0 04/14/2022   CL 104 04/14/2022   CO2 27 04/14/2022   BUN 17 04/14/2022   CREATININE 1.11 04/14/2022   GFRNONAA >60 02/01/2017   CALCIUM 9.4 04/14/2022   PROT 7.8 04/14/2022   ALBUMIN 4.8 04/14/2022   BILITOT 0.7 04/14/2022   ALKPHOS 56 04/14/2022   AST 16 04/14/2022   ALT 20 04/14/2022   ANIONGAP 6 02/01/2017   Lab Results  Component Value Date    HGBA1C 4.8 04/14/2022   Lab Results  Component Value Date   TSH 3.11 04/14/2022    At today's visit, we discussed treatment options, associated risk and benefits, and engage in counseling as needed.  Additionally the following were reviewed: Past medical records, past medical and surgical history, family and social background, as well as relevant laboratory results, imaging findings, and specialty notes, where applicable.  This message was generated using dictation software, and as a result, it may contain unintentional typos or errors.  Nevertheless, extensive effort was made to accurately convey at the pertinent aspects of the patient visit.    There may have been are other unrelated non-urgent complaints, but due to the busy schedule and the amount of time already spent with him, time does not permit to address these issues at today's visit. Another appointment may have or has been requested to review these additional issues.   Thomes Dinning, MD, MS

## 2023-05-21 LAB — IRON,TIBC AND FERRITIN PANEL: %SAT: 28 % (calc) (ref 20–48)

## 2023-05-21 LAB — EXTRA SPECIMEN

## 2023-05-21 LAB — HEPATITIS B CORE ANTIBODY, TOTAL: Hep B Core Total Ab: NONREACTIVE

## 2023-05-23 ENCOUNTER — Other Ambulatory Visit: Payer: Self-pay | Admitting: Family Medicine

## 2023-05-23 DIAGNOSIS — F410 Panic disorder [episodic paroxysmal anxiety] without agoraphobia: Secondary | ICD-10-CM

## 2023-05-23 DIAGNOSIS — F322 Major depressive disorder, single episode, severe without psychotic features: Secondary | ICD-10-CM

## 2023-05-23 LAB — HEPATITIS B SURFACE ANTIGEN: Hepatitis B Surface Ag: NONREACTIVE

## 2023-05-23 LAB — VITAMIN D 1,25 DIHYDROXY
Vitamin D 1, 25 (OH)2 Total: 31 pg/mL (ref 18–72)
Vitamin D2 1, 25 (OH)2: <8 pg/mL
Vitamin D3 1, 25 (OH)2: 31 pg/mL

## 2023-05-23 LAB — IRON,TIBC AND FERRITIN PANEL
Ferritin: 57 ng/mL (ref 38–380)
Iron: 92 ug/dL (ref 50–195)
TIBC: 334 mcg/dL (calc) (ref 250–425)

## 2023-05-23 LAB — HEPATITIS C ANTIBODY: Hepatitis C Ab: NONREACTIVE

## 2023-05-23 LAB — EXTRA SPECIMEN

## 2023-05-23 LAB — HIV ANTIBODY (ROUTINE TESTING W REFLEX): HIV 1&2 Ab, 4th Generation: NONREACTIVE

## 2023-05-23 LAB — HEPATITIS B SURFACE ANTIBODY,QUALITATIVE: Hep B S Ab: REACTIVE — AB

## 2023-05-25 ENCOUNTER — Telehealth: Payer: Self-pay | Admitting: Family Medicine

## 2023-05-25 DIAGNOSIS — F410 Panic disorder [episodic paroxysmal anxiety] without agoraphobia: Secondary | ICD-10-CM

## 2023-05-25 DIAGNOSIS — F322 Major depressive disorder, single episode, severe without psychotic features: Secondary | ICD-10-CM

## 2023-05-25 MED ORDER — ESCITALOPRAM OXALATE 20 MG PO TABS
20.0000 mg | ORAL_TABLET | Freq: Every day | ORAL | 0 refills | Status: DC
Start: 2023-05-25 — End: 2023-06-20

## 2023-05-25 NOTE — Telephone Encounter (Signed)
Prescription Request  05/25/2023  LOV: 05/20/2023  What is the name of the medication or equipment? escitalopram (LEXAPRO) 20 MG tablet [[id number]]   Have you contacted your pharmacy to request a refill? Yes   Which pharmacy would you like this sent to?  [id number] [place], [place] - [address] [place] [address] Phone: [phone] Fax: [phone]    Patient notified that their request is being sent to the clinical staff for review and that they should receive a response within 2 business days.   Please advise at Mobile [phone] (mobile)

## 2023-05-25 NOTE — Telephone Encounter (Signed)
Chart supports rx. Last OV: 05/20/2023 Next OV: 06/20/2023

## 2023-06-20 ENCOUNTER — Telehealth (INDEPENDENT_AMBULATORY_CARE_PROVIDER_SITE_OTHER): Payer: Medicaid Other | Admitting: Family Medicine

## 2023-06-20 DIAGNOSIS — F411 Generalized anxiety disorder: Secondary | ICD-10-CM

## 2023-06-20 DIAGNOSIS — F410 Panic disorder [episodic paroxysmal anxiety] without agoraphobia: Secondary | ICD-10-CM

## 2023-06-20 DIAGNOSIS — G479 Sleep disorder, unspecified: Secondary | ICD-10-CM | POA: Diagnosis not present

## 2023-06-20 DIAGNOSIS — R5383 Other fatigue: Secondary | ICD-10-CM

## 2023-06-20 DIAGNOSIS — B349 Viral infection, unspecified: Secondary | ICD-10-CM | POA: Diagnosis not present

## 2023-06-20 DIAGNOSIS — F322 Major depressive disorder, single episode, severe without psychotic features: Secondary | ICD-10-CM

## 2023-06-20 DIAGNOSIS — F321 Major depressive disorder, single episode, moderate: Secondary | ICD-10-CM

## 2023-06-20 DIAGNOSIS — F331 Major depressive disorder, recurrent, moderate: Secondary | ICD-10-CM | POA: Diagnosis not present

## 2023-06-20 DIAGNOSIS — F32A Depression, unspecified: Secondary | ICD-10-CM | POA: Diagnosis not present

## 2023-06-20 MED ORDER — BUPROPION HCL ER (XL) 300 MG PO TB24: 300.0000 mg | ORAL_TABLET | Freq: Every day | ORAL | 1 refills | Status: DC

## 2023-06-20 MED ORDER — ESCITALOPRAM OXALATE 20 MG PO TABS
20.0000 mg | ORAL_TABLET | Freq: Every day | ORAL | 1 refills | Status: DC
Start: 2023-06-20 — End: 2024-09-06

## 2023-06-20 NOTE — Assessment & Plan Note (Signed)
The patient reports recurrent viral illnesses, which could potentially fall under post-COVID syndrome. Their extensive work-up was generally normal, with only a slightly elevated ESR, indicating minimal chronic inflammation. No immediate respiratory issues are present.  Differential Diagnosis:  Post-COVID Syndrome: Given the timing, patient history, and commonality of symptoms, this is the most probable differential diagnosis. Common Variable Immune Deficiency (CVID): Less likely as there are no significant immunoglobulin deficiencies noted from the patient's tests. Recurrent Viral URI: Possible due to a weakened immune system or frequent exposures.  Plan:  Continue monitoring the patient's symptoms. Advise the patient to maintain a healthy lifestyle, proper sleep hygiene, and a balanced diet to bolster immune function. Provide reassurance and educate the patient about common post-COVID symptoms, emphasizing that many people experience a similar condition. Recommend an in-person visit if symptoms worsen or persist for further evaluation. Consider a follow-up blood test if regular symptoms continue or escalate to assess any new developments in inflammatory markers or immune function.

## 2023-06-20 NOTE — Progress Notes (Signed)
Virtual Visit via Video Note  I connected with Miguel Dunn on 06/20/2023 at 10:40 AM EDT by a video enabled telemedicine application and verified that I am speaking with the correct person using two identifiers.  Location: Patient: Office Provider: Home   I discussed the limitations of evaluation and management by telemedicine and the availability of in person appointments. The patient expressed understanding and agreed to proceed.  History of Present Illness:  Chief Complaint  Patient presents with   Medical Management of Chronic Issues    PHQ/GAD   History of Present Illness:  Viral Illness: The patient complains of recurrent viral-type illnesses, including colds and coughs, over the past several years, which have been more frequent since the COVID-19 pandemic. The patient reports having no current symptoms such as cold, cough, runny nose, wheezing, chest pain, or shortness of breath. A previous extensive work-up including blood tests for thyroid, blood sugar, vitamin D, liver function, electrolytes, inflammatory markers, iron levels, hepatitis B, hepatitis C, and HIV, all returned normal results. ESR was slightly elevated at 19, but this is not a significant concern. Patient maintains an active lifestyle and has negative extensive infectious disease history.   Depression and Anxiety: The patient also complains of ongoing fatigue and depressive symptoms. Depression scores have improved from an 18 (extremely difficult) to a 12 (very difficult) on the PHQ-9 scale. They initially used escitalopram 20 mg daily but added bupropion at 150 mg daily to help with depressive symptoms and fatigue, finding some improvement but continuing fatigue issues. Sleep disturbances are noted, but no formal sleep study has been conducted.     06/20/2023   10:42 AM 05/20/2023    9:47 AM 01/11/2023    9:47 AM 12/15/2022    3:12 PM 04/14/2022   10:10 AM  Depression screen PHQ 2/9  Decreased Interest 1 3 2 3 3    Down, Depressed, Hopeless 3 3 2 3 3   PHQ - 2 Score 4 6 4 6 6   Altered sleeping 1 2 3 3 2   Tired, decreased energy 3 3 3 3 3   Change in appetite 0 1 0 1 1  Feeling bad or failure about yourself  2 3 3 3 3   Trouble concentrating 2 3 2 3 1   Moving slowly or fidgety/restless 0 0 0 3 0  Suicidal thoughts 0 0 0 0 0  PHQ-9 Score 12 18 15 22 16   Difficult doing work/chores Very difficult Extremely dIfficult Extremely dIfficult Extremely dIfficult Very difficult      06/20/2023   10:44 AM 05/20/2023    9:47 AM 01/11/2023    9:48 AM 12/15/2022    3:12 PM  GAD 7 : Generalized Anxiety Score  Nervous, Anxious, on Edge 1 1 1 3   Control/stop worrying 2 1 2 3   Worry too much - different things 2 1 3 3   Trouble relaxing 0 1 1 3   Restless 0 0 0 2  Easily annoyed or irritable 1 2 2 3   Afraid - awful might happen 1 2 3 3   Total GAD 7 Score 7 8 12 20   Anxiety Difficulty Somewhat difficult Somewhat difficult Somewhat difficult Extremely difficult      Review of Systems  Constitutional:  Positive for malaise/fatigue.  HENT:  Negative for congestion.   Respiratory:  Negative for cough, shortness of breath and wheezing.   Psychiatric/Behavioral:  Positive for depression. The patient is nervous/anxious and has insomnia.     Observations/Objective: There were no vitals filed for this visit.  Gen: NAD, resting comfortably HEENT: EOMI Pulm: NWOB Skin: no rash on face Neuro: no facial asymmetry or dysmetria Psych: Cooperative    Assessment and Plan: Problem List Items Addressed This Visit       Other   Depression, major, single episode, moderate (HCC)    Patient's PHQ-9 score improved from 18 (extremely difficult) to 12 (very difficult). Continue escitalopram 20 mg daily. Increase bupropion to 300 mg for mood improvement and potential stimulatory effects. Follow up in 3 months to monitor the effectiveness and any side effects.      Relevant Medications   escitalopram (LEXAPRO) 20 MG  tablet   buPROPion (WELLBUTRIN XL) 300 MG 24 hr tablet   Panic anxiety syndrome   Relevant Medications   escitalopram (LEXAPRO) 20 MG tablet   buPROPion (WELLBUTRIN XL) 300 MG 24 hr tablet   Other fatigue    Comprehensive lab workup to rule out other causes of fatigue came back normal. Sleep history suggests issues with staying asleep. Consider sleep study to evaluate for sleep apnea or REM disorder. Discussed sleep hygiene and the impact of stimulants like alcohol and caffeine.      Recurrent viral infection    The patient reports recurrent viral illnesses, which could potentially fall under post-COVID syndrome. Their extensive work-up was generally normal, with only a slightly elevated ESR, indicating minimal chronic inflammation. No immediate respiratory issues are present.  Differential Diagnosis:  Post-COVID Syndrome: Given the timing, patient history, and commonality of symptoms, this is the most probable differential diagnosis. Common Variable Immune Deficiency (CVID): Less likely as there are no significant immunoglobulin deficiencies noted from the patient's tests. Recurrent Viral URI: Possible due to a weakened immune system or frequent exposures.  Plan:  Continue monitoring the patient's symptoms. Advise the patient to maintain a healthy lifestyle, proper sleep hygiene, and a balanced diet to bolster immune function. Provide reassurance and educate the patient about common post-COVID symptoms, emphasizing that many people experience a similar condition. Recommend an in-person visit if symptoms worsen or persist for further evaluation. Consider a follow-up blood test if regular symptoms continue or escalate to assess any new developments in inflammatory markers or immune function.      Sleep difficulties   Relevant Orders   Ambulatory referral to Sleep Studies   Other Visit Diagnoses     Moderate episode of recurrent major depressive disorder (HCC)    -  Primary    Relevant Medications   escitalopram (LEXAPRO) 20 MG tablet   buPROPion (WELLBUTRIN XL) 300 MG 24 hr tablet   GAD (generalized anxiety disorder)       Relevant Medications   escitalopram (LEXAPRO) 20 MG tablet   buPROPion (WELLBUTRIN XL) 300 MG 24 hr tablet   Current severe episode of major depressive disorder without psychotic features, unspecified whether recurrent (HCC)       Relevant Medications   escitalopram (LEXAPRO) 20 MG tablet   buPROPion (WELLBUTRIN XL) 300 MG 24 hr tablet       Medications Discontinued During This Encounter  Medication Reason   buPROPion (WELLBUTRIN XL) 150 MG 24 hr tablet Reorder   escitalopram (LEXAPRO) 20 MG tablet Reorder     Follow Up Instructions: No follow-ups on file.    I discussed the assessment and treatment plan with the patient. The patient was provided an opportunity to ask questions and all were answered. The patient agreed with the plan and demonstrated an understanding of the instructions.   The patient was advised to call back  or seek an in-person evaluation if the symptoms worsen or if the condition fails to improve as anticipated. Garnette Gunner, MD

## 2023-06-20 NOTE — Assessment & Plan Note (Signed)
Patient's PHQ-9 score improved from 18 (extremely difficult) to 12 (very difficult). Continue escitalopram 20 mg daily. Increase bupropion to 300 mg for mood improvement and potential stimulatory effects. Follow up in 3 months to monitor the effectiveness and any side effects.

## 2023-06-20 NOTE — Assessment & Plan Note (Signed)
Comprehensive lab workup to rule out other causes of fatigue came back normal. Sleep history suggests issues with staying asleep. Consider sleep study to evaluate for sleep apnea or REM disorder. Discussed sleep hygiene and the impact of stimulants like alcohol and caffeine.

## 2023-06-28 NOTE — Addendum Note (Signed)
Addended by: Fanny Bien B on: 06/28/2023 01:41 PM   Modules accepted: Level of Service

## 2023-08-16 ENCOUNTER — Other Ambulatory Visit: Payer: Self-pay | Admitting: Family Medicine

## 2023-08-16 DIAGNOSIS — F321 Major depressive disorder, single episode, moderate: Secondary | ICD-10-CM

## 2023-08-16 DIAGNOSIS — F411 Generalized anxiety disorder: Secondary | ICD-10-CM

## 2023-12-04 ENCOUNTER — Telehealth: Payer: Medicaid Other | Admitting: Family Medicine

## 2023-12-04 DIAGNOSIS — J069 Acute upper respiratory infection, unspecified: Secondary | ICD-10-CM | POA: Diagnosis not present

## 2023-12-04 MED ORDER — BENZONATATE 200 MG PO CAPS: 200.0000 mg | ORAL_CAPSULE | Freq: Two times a day (BID) | ORAL | 0 refills | Status: DC | PRN

## 2023-12-04 MED ORDER — FLUTICASONE PROPIONATE 50 MCG/ACT NA SUSP: 2.0000 | Freq: Every day | NASAL | 6 refills | Status: DC

## 2023-12-04 NOTE — Progress Notes (Signed)

## 2024-09-04 ENCOUNTER — Ambulatory Visit: Payer: Self-pay

## 2024-09-04 NOTE — Telephone Encounter (Signed)
 FYI Only or Action Required?: Action required by provider: request for appointment. (Sooner appt with PCP)  Patient was last seen in primary care on 06/20/2023 by Sebastian Beverley NOVAK, MD.  Called Nurse Triage reporting Anxiety.  Symptoms began several months ago.  Interventions attempted: Prescription medications: restarted Lexapro  week ago (pt had an exra supply when dose was increased.  . Had been out since January..  Symptoms are: gradually worsening.  Triage Disposition: See PCP When Office is Open (Within 3 Days)  Patient/caregiver understands and will follow disposition?: yes- pt made aware to come in for appt. Not virtual.          Copied from CRM [id number]. Topic: Clinical - Red Word Triage >> Sep 04, 2024  3:41 PM Hamdi H wrote: Red Word that prompted transfer to Nurse Triage: Increased anxiety Reason for Disposition  MODERATE anxiety (e.g., persistent or frequent anxiety symptoms; interferes with sleep, school, or work)  Answer Assessment - Initial Assessment Questions 1. CONCERN: Did anything happen that prompted you to call today?      Was on Lexapro  getting worse with anxiety- early this yer  2. ANXIETY SYMPTOMS: Can you describe how you (your loved one; patient) have been feeling? (e.g., tense, restless, panicky, anxious, keyed up, overwhelmed, sense of impending doom).      Anxious  3. ONSET: How long have you been feeling this way? (e.g., hours, days, weeks)     chronic 4. SEVERITY: How would you rate the level of anxiety? (e.g., 0 - 10; or mild, moderate, severe).     9/10 then when relax 5-6/10 anxiety  5. FUNCTIONAL IMPAIRMENT: How have these feelings affected your ability to do daily activities? Have you had more difficulty than usual doing your normal daily activities? (e.g., getting better, same, worse; self-care, school, work, interactions)     worse 6. HISTORY: Have you felt this way before? Have you ever been diagnosed with an anxiety problem  in the past? (e.g., generalized anxiety disorder, panic attacks, PTSD). If Yes, ask: How was this problem treated? (e.g., medicines, counseling, etc.)     Yes tx for anxiety 7. RISK OF HARM - SUICIDAL IDEATION: Do you ever have thoughts of hurting or killing yourself? If Yes, ask:  Do you have these feelings now? Do you have a plan on how you would do this?     no 8. TREATMENT:  What has been done so far to treat this anxiety? (e.g., medicines, relaxation strategies). What has helped?     Medicine  9. THERAPIST: Do you have a counselor or therapist? If Yes, ask: What is their name?     Therapist [facility]  10. POTENTIAL TRIGGERS: Do you drink caffeinated beverages (e.g., coffee, colas, teas), and how much daily? Do you drink alcohol or use any drugs? Have you started any new medicines recently?       2 coffees/day //no 11. PATIENT SUPPORT: Who is with you now? Who do you live with? Do you have family or friends who you can talk to?        *No Answer* 12. OTHER SYMPTOMS: Do you have any other symptoms? (e.g., feeling depressed, trouble concentrating, trouble sleeping, trouble breathing, palpitations or fast heartbeat, chest pain, sweating, nausea, or diarrhea)       Trouble sleeping (hard to fall asleep)- sometimes has to sleep longer to recruit lost sleep time  Protocols used: Anxiety and Panic Attack-A-AH

## 2024-09-05 NOTE — Telephone Encounter (Signed)
No sooner appt available

## 2024-09-06 ENCOUNTER — Ambulatory Visit (INDEPENDENT_AMBULATORY_CARE_PROVIDER_SITE_OTHER): Admitting: Family Medicine

## 2024-09-06 VITALS — BP 126/81 | HR 61 | Temp 97.1°F | Resp 18 | Wt 246.0 lb

## 2024-09-06 DIAGNOSIS — F321 Major depressive disorder, single episode, moderate: Secondary | ICD-10-CM

## 2024-09-06 DIAGNOSIS — L700 Acne vulgaris: Secondary | ICD-10-CM | POA: Diagnosis not present

## 2024-09-06 DIAGNOSIS — F410 Panic disorder [episodic paroxysmal anxiety] without agoraphobia: Secondary | ICD-10-CM

## 2024-09-06 MED ORDER — ESCITALOPRAM OXALATE 20 MG PO TABS: 20.0000 mg | ORAL_TABLET | Freq: Every day | ORAL | 1 refills | Status: DC

## 2024-09-06 NOTE — Patient Instructions (Signed)
 Please start taking lexapro  20MG  and follow up in 1 month for physical and mood with fastings labs

## 2024-09-06 NOTE — Progress Notes (Signed)
 Assessment & Plan   Assessment/Plan:   Assessment & Plan Generalized anxiety disorder with comorbid depression and insomnia Generalized anxiety disorder and depression have worsened since discontinuation of escitalopram  and bupropion  due to insurance issues. Anxiety is severe, exacerbated by life stressors including a sick mother. Depression is present but less severe. Insomnia has improved with lifestyle changes, including reduced caffeine intake and room accommodations. Escitalopram  was previously effective, and bupropion  was beneficial. He prefers to restart escitalopram  first and assess response before considering bupropion . - Prescribe escitalopram  20 mg daily - Evaluate response to escitalopram  in one month - Consider reintroducing bupropion  or alternative if needed - Monitor sleep patterns and consider medication if sleep worsens  Acne Acne has worsened after a period of improvement. Previous referral to dermatology was not utilized due to insurance issues, but he now has better insurance coverage. - Provide referral to dermatology for acne management      Medications Discontinued During This Encounter  Medication Reason   benzonatate  (TESSALON ) 200 MG capsule    fluticasone  (FLONASE ) 50 MCG/ACT nasal spray    escitalopram  (LEXAPRO ) 20 MG tablet Reorder    Return in about 1 month (around 10/06/2024) for physcial and mood fasting labs .        Subjective:   Encounter date: 09/06/2024  Miguel Dunn is a 28 y.o. male who has Depression, major, single episode, moderate (HCC); Acne vulgaris; Panic anxiety syndrome; Class 1 obesity without serious comorbidity in adult; Other fatigue; Recurrent viral infection; and Sleep difficulties on their problem list..   He  has a past medical history of Anxiety and Depression.Miguel Dunn   He presents with chief complaint of Anxiety (Pt is fasting today. Rx refill request for lexapro  20MG  //HM due- vaccinations (patient declined)) .    Discussed the use of AI scribe software for clinical note transcription with the patient, who gave verbal consent to proceed.  History of Present Illness Miguel Dunn is a 28 year old male with anxiety and depression who presents for follow-up.  Anxiety and depressive symptoms - Increased anxiety and depression since discontinuing escitalopram  20 mg daily and bupropion  300 mg extended-release daily earlier this year due to insurance issues - Anxiety described as 'through the roof' in recent months - Depression persistent but less severe than at last visit - Current stress attributed to life events, including significant concern over his mother's illness - Recently restarted escitalopram  10 mg from an old prescription, found it beneficial - Desires to restart escitalopram , as it was particularly effective in the past  Sleep disturbance - Sleep has improved since last visit - Improvement attributed to lifestyle changes, including reducing caffeine intake to approximately 10 ounces of coffee per day and making his room more comfortable - Anxiety occasionally interferes with sleep, but overall sleep quality is better than before  Tick bite and lyme disease concern - History of tick bite in July 2025 - Concerned about Lyme disease due to personal tick exposure and his mother's diagnosis of Lyme disease - Requests Lyme disease testing  Acne vulgaris - Requests dermatology referral for acne - Acne had previously improved but has recently worsened again       09/06/2024   10:01 AM 06/20/2023   10:42 AM 05/20/2023    9:47 AM 01/11/2023    9:47 AM 12/15/2022    3:12 PM  Depression screen PHQ 2/9  Decreased Interest 3 1 3 2 3   Down, Depressed, Hopeless 2 3 3 2 3   PHQ - 2 Score 5  4 6 4 6   Altered sleeping 2 1 2 3 3   Tired, decreased energy 3 3 3 3 3   Change in appetite 0 0 1 0 1  Feeling bad or failure about yourself  3 2 3 3 3   Trouble concentrating 1 2 3 2 3   Moving slowly or  fidgety/restless 0 0 0 0 3  Suicidal thoughts 0 0 0 0 0  PHQ-9 Score 14 12 18 15 22   Difficult doing work/chores Somewhat difficult Very difficult Extremely dIfficult Extremely dIfficult Extremely dIfficult      09/06/2024   10:01 AM 06/20/2023   10:44 AM 05/20/2023    9:47 AM 01/11/2023    9:48 AM  GAD 7 : Generalized Anxiety Score  Nervous, Anxious, on Edge 3 1 1 1   Control/stop worrying 3 2 1 2   Worry too much - different things 3 2 1 3   Trouble relaxing 2 0 1 1  Restless 1 0 0 0  Easily annoyed or irritable 2 1 2 2   Afraid - awful might happen 3 1 2 3   Total GAD 7 Score 17 7 8 12   Anxiety Difficulty Extremely difficult Somewhat difficult Somewhat difficult Somewhat difficult     ROS  History reviewed. No pertinent surgical history.  Outpatient Medications Prior to Visit  Medication Sig Dispense Refill   buPROPion  (WELLBUTRIN  XL) 300 MG 24 hr tablet Take 1 tablet (300 mg total) by mouth daily. 90 tablet 1   escitalopram  (LEXAPRO ) 20 MG tablet Take 1 tablet (20 mg total) by mouth daily. 90 tablet 1   benzonatate  (TESSALON ) 200 MG capsule Take 1 capsule (200 mg total) by mouth 2 (two) times daily as needed for cough. 20 capsule 0   fluticasone  (FLONASE ) 50 MCG/ACT nasal spray Place 2 sprays into both nostrils daily. 16 g 6   No facility-administered medications prior to visit.    History reviewed. No pertinent family history.  Social History   Socioeconomic History   Marital status: Single    Spouse name: Not on file   Number of children: Not on file   Years of education: Not on file   Highest education level: Bachelor's degree (e.g., BA, AB, BS)  Occupational History   Not on file  Tobacco Use   Smoking status: Never   Smokeless tobacco: Never  Vaping Use   Vaping status: Never Used  Substance and Sexual Activity   Alcohol use: Not Currently    Comment: occ   Drug use: Never   Sexual activity: Never  Other Topics Concern   Not on file  Social History  Narrative   Not on file   Social Drivers of Health   Financial Resource Strain: High Risk (09/06/2024)   Overall Financial Resource Strain (CARDIA)    Difficulty of Paying Living Expenses: Very hard  Food Insecurity: Food Insecurity Present (09/06/2024)   Hunger Vital Sign    Worried About Running Out of Food in the Last Year: Sometimes true    Ran Out of Food in the Last Year: Patient declined  Transportation Needs: Unmet Transportation Needs (09/06/2024)   PRAPARE - Transportation    Lack of Transportation (Medical): Yes    Lack of Transportation (Non-Medical): Yes  Physical Activity: Insufficiently Active (09/06/2024)   Exercise Vital Sign    Days of Exercise per Week: 2 days    Minutes of Exercise per Session: 20 min  Stress: Stress Concern Present (09/06/2024)   [employer] of Occupational Health - Occupational Stress Questionnaire  Feeling of Stress: Rather much  Social Connections: Socially Isolated (09/06/2024)   Social Connection and Isolation Panel    Frequency of Communication with Friends and Family: Three times a week    Frequency of Social Gatherings with Friends and Family: Twice a week    Attends Religious Services: Never    Database administrator or Organizations: No    Attends Engineer, structural: Not on file    Marital Status: Never married  Intimate Partner Violence: Unknown (03/15/2022)   Received from [facility]   HITS    Physically Hurt: Not on file    Insult or Talk Down To: Not on file    Threaten Physical Harm: Not on file    Scream or Curse: Not on file                                                                                                  Objective:  Physical Exam: BP 126/81 (BP Location: Left Arm, Patient Position: Sitting, Cuff Size: Large)   Pulse 61   Temp (!) 97.1 F (36.2 C) (Temporal)   Resp 18   Wt 246 lb (111.6 kg)   SpO2 96%   BMI 34.31 kg/m    Physical Exam GENERAL: Alert, cooperative, well developed,  no acute distress. HEENT: Normocephalic, normal oropharynx, moist mucous membranes. CHEST: Clear to auscultation bilaterally, no wheezes, rhonchi, or crackles. CARDIOVASCULAR: Regular rate and rhythm, S1 and S2 normal without murmurs. ABDOMEN: Soft, non-tender, non-distended, without organomegaly, normal bowel sounds. EXTREMITIES: No cyanosis or edema. NEUROLOGICAL: Cranial nerves grossly intact, moves all extremities without gross motor or sensory deficit.   Physical Exam  No results found.  No results found for this or any previous visit (from the past 2160 hours).      Beverley Adine Hummer, MD, MS

## 2024-10-11 ENCOUNTER — Ambulatory Visit (INDEPENDENT_AMBULATORY_CARE_PROVIDER_SITE_OTHER): Admitting: Family Medicine

## 2024-10-11 ENCOUNTER — Encounter: Payer: Self-pay | Admitting: Family Medicine

## 2024-10-11 VITALS — BP 105/70 | HR 63 | Temp 97.0°F | Resp 18 | Wt 241.4 lb

## 2024-10-11 DIAGNOSIS — E782 Mixed hyperlipidemia: Secondary | ICD-10-CM

## 2024-10-11 DIAGNOSIS — S30861S Insect bite (nonvenomous) of abdominal wall, sequela: Secondary | ICD-10-CM

## 2024-10-11 DIAGNOSIS — E66811 Obesity, class 1: Secondary | ICD-10-CM

## 2024-10-11 DIAGNOSIS — Z0000 Encounter for general adult medical examination without abnormal findings: Secondary | ICD-10-CM | POA: Diagnosis not present

## 2024-10-11 DIAGNOSIS — L700 Acne vulgaris: Secondary | ICD-10-CM | POA: Diagnosis not present

## 2024-10-11 DIAGNOSIS — E6609 Other obesity due to excess calories: Secondary | ICD-10-CM | POA: Diagnosis not present

## 2024-10-11 DIAGNOSIS — Z6830 Body mass index (BMI) 30.0-30.9, adult: Secondary | ICD-10-CM

## 2024-10-11 DIAGNOSIS — F321 Major depressive disorder, single episode, moderate: Secondary | ICD-10-CM

## 2024-10-11 DIAGNOSIS — F99 Mental disorder, not otherwise specified: Secondary | ICD-10-CM

## 2024-10-11 DIAGNOSIS — F5105 Insomnia due to other mental disorder: Secondary | ICD-10-CM

## 2024-10-11 DIAGNOSIS — Z0001 Encounter for general adult medical examination with abnormal findings: Secondary | ICD-10-CM

## 2024-10-11 DIAGNOSIS — W57XXXS Bitten or stung by nonvenomous insect and other nonvenomous arthropods, sequela: Secondary | ICD-10-CM

## 2024-10-11 DIAGNOSIS — F410 Panic disorder [episodic paroxysmal anxiety] without agoraphobia: Secondary | ICD-10-CM | POA: Diagnosis not present

## 2024-10-11 LAB — HIGH SENSITIVITY CRP: CRP, High Sensitivity: 0.9 mg/L (ref 0.000–5.000)

## 2024-10-11 LAB — MICROALBUMIN / CREATININE URINE RATIO
Creatinine,U: 247 mg/dL
Microalb Creat Ratio: 3 mg/g (ref 0.0–30.0)
Microalb, Ur: 0.7 mg/dL (ref 0.0–1.9)

## 2024-10-11 LAB — COMPREHENSIVE METABOLIC PANEL WITH GFR
ALT: 14 U/L (ref 0–53)
AST: 13 U/L (ref 0–37)
Albumin: 5 g/dL (ref 3.5–5.2)
Alkaline Phosphatase: 54 U/L (ref 39–117)
BUN: 19 mg/dL (ref 6–23)
CO2: 29 meq/L (ref 19–32)
Calcium: 9.6 mg/dL (ref 8.4–10.5)
Chloride: 101 meq/L (ref 96–112)
Creatinine, Ser: 1.11 mg/dL (ref 0.40–1.50)
GFR: 91 mL/min (ref >60.00)
Glucose, Bld: 86 mg/dL (ref 70–99)
Potassium: 4.3 meq/L (ref 3.5–5.1)
Sodium: 137 meq/L (ref 135–145)
Total Bilirubin: 0.6 mg/dL (ref 0.2–1.2)
Total Protein: 7.9 g/dL (ref 6.0–8.3)

## 2024-10-11 LAB — CBC WITH DIFFERENTIAL/PLATELET
Basophils Absolute: 0 K/uL (ref 0.0–0.1)
Basophils Relative: 0.2 % (ref 0.0–3.0)
Eosinophils Absolute: 0.1 K/uL (ref 0.0–0.7)
Eosinophils Relative: 2 % (ref 0.0–5.0)
HCT: 46.2 % (ref 39.0–52.0)
Hemoglobin: 15.9 g/dL (ref 13.0–17.0)
Lymphocytes Relative: 29.1 % (ref 12.0–46.0)
Lymphs Abs: 2 K/uL (ref 0.7–4.0)
MCHC: 34.5 g/dL (ref 30.0–36.0)
MCV: 84.3 fl (ref 78.0–100.0)
Monocytes Absolute: 0.5 K/uL (ref 0.1–1.0)
Monocytes Relative: 6.9 % (ref 3.0–12.0)
Neutro Abs: 4.2 K/uL (ref 1.4–7.7)
Neutrophils Relative %: 61.8 % (ref 43.0–77.0)
Platelets: 200 K/uL (ref 150.0–400.0)
RBC: 5.48 Mil/uL (ref 4.22–5.81)
RDW: 12.9 % (ref 11.5–15.5)
WBC: 6.8 K/uL (ref 4.0–10.5)

## 2024-10-11 LAB — LIPID PANEL
Cholesterol: 160 mg/dL (ref 0–200)
HDL: 42.8 mg/dL (ref >39.00)
LDL Cholesterol: 98 mg/dL (ref 0–99)
NonHDL: 116.85
Total CHOL/HDL Ratio: 4
Triglycerides: 95 mg/dL (ref 0.0–149.0)
VLDL: 19 mg/dL (ref 0.0–40.0)

## 2024-10-11 LAB — B12 AND FOLATE PANEL
Folate: 8.1 ng/mL (ref >5.9)
Vitamin B-12: 245 pg/mL (ref 211–911)

## 2024-10-11 MED ORDER — MIRTAZAPINE 15 MG PO TABS: ORAL_TABLET | ORAL | 0 refills | Status: DC

## 2024-10-11 NOTE — Progress Notes (Signed)
 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 Anxiety improved (GAD-7: 17 ? 9). Plan: Start mirtazapine 7.5 mg nightly 1 week, then increase to 15 mg if tolerated Follow up in 1 month Orders: Iron/TIBC/Ferritin, B12/Folate, Vitamin D     Class 1 Obesity (BMI 33)  Counsel on diet/exercise; screen for metabolic risk. Orders: TSH, C-peptide, insulin, A1c, lipid panel, Apo A/B ratio, hs-CRP, CMP/GFR, CBC, UA, microalbumin    Hyperlipidemia (Mild)  Cardiovascular risk assessment. Orders: Included in obesity panel    Acne Vulgaris  Refer to dermatologist Erminio Goods    Tick Bite - Lyme Concern  Order Lyme serology w/ reflex    Adult Wellness Visit  Annual physical completed; reinforce healthy lifestyle. Orders: Overlap with obesity/hyperlipidemia + vitamin deficiency panel    There are no discontinued medications.  Patient Counseling(The following topics were reviewed and/or handout was given):  -Nutrition: Stressed importance of moderation in sodium/caffeine intake, saturated fat and cholesterol, caloric balance, sufficient intake of fresh fruits, vegetables, and fiber.  -Stressed the importance of regular exercise.   -Substance Abuse: Discussed cessation/primary prevention of tobacco, alcohol, or other drug use; driving or other dangerous activities under the influence; availability of treatment for abuse.   -Injury prevention: Discussed safety belts, safety helmets, smoke detector, smoking near bedding or upholstery.   -Sexuality: Discussed sexually transmitted diseases, partner selection, use of condoms, avoidance of unintended pregnancy and contraceptive alternatives.   -Dental health: Discussed importance of regular tooth brushing, flossing, and dental visits.  -Health maintenance and immunizations reviewed. Please refer to Health  maintenance section.  Return in about 1 month (around 11/11/2024) for Depression, anxiety, insomnia.        Subjective:   Encounter date: 10/11/2024  Chief Complaint  Patient presents with   Annual Exam    Pt is fasting today  HM due- flu vaccine (Pt declined)    Acne    Pt c/o of acne on back, shoulder and face; requesting dermatology referral to [facility])    Anxiety    Rx refill request for lexapro  20MG      Discussed the use of AI scribe software for clinical note transcription with the patient, who gave verbal consent to proceed.  History of Present Illness Miguel Dunn, 28 year old male, presents for annual physical and depression follow-up.  Depression/Anxiety: Restarted Lexapro  20 mg daily on 09/06/2024 after prior discontinuation due to insurance issues. GAD-7 improved from 17 ? 9; PHQ-9 from 14 ? 11, though symptom difficulty increased to "very difficult." Reports situational stress related to life events and mother's health. Sleep disturbance persists but improved (1-2 nights/week). No chest pain, SOB, or palpitations. Medication Effects: Lexapro  helpful for mood but causes persistent daytime nausea (limits eating until dinner) and increased nighttime sweet cravings. Previously trialed bupropion  (helpful, no side effects) but discontinued due to insurance. Obesity/Hyperlipidemia: BMI 33; history of mild hyperlipidemia with prior mildly elevated cholesterol. Acne Vulgaris: Referred to dermatology on 09/06/2024; appointment delayed. Requests referral to [facility]. Tick Bite/Lyme Concern: Tick bite occurred a few months ago; concerned due to maternal history of Lyme disease. Previously tested 1-2 years ago.        10/11/2024    9:13 AM 10/11/2024    9:03 AM 09/06/2024   10:01 AM 06/20/2023   10:42 AM 05/20/2023    9:47 AM  Depression screen PHQ 2/9  Decreased Interest 2 0 3 1 3  Down, Depressed, Hopeless 2 0 2 3 3   PHQ - 2 Score 4 0 5  4 6   Altered sleeping 1  2 1 2   Tired, decreased energy 3  3 3 3   Change in appetite 0  0 0 1  Feeling bad or failure about yourself  2  3 2 3   Trouble concentrating 1  1 2 3   Moving slowly or fidgety/restless 0  0 0 0  Suicidal thoughts 0  0 0 0  PHQ-9 Score 11  14 12 18   Difficult doing work/chores Very difficult  Somewhat difficult Very difficult Extremely dIfficult       10/11/2024    9:14 AM 09/06/2024   10:01 AM 06/20/2023   10:44 AM 05/20/2023    9:47 AM  GAD 7 : Generalized Anxiety Score  Nervous, Anxious, on Edge 2 3 1 1   Control/stop worrying 1 3 2 1   Worry too much - different things 2 3 2 1   Trouble relaxing 1 2 0 1  Restless 0 1 0 0  Easily annoyed or irritable 2 2 1 2   Afraid - awful might happen 1 3 1 2   Total GAD 7 Score 9 17 7 8   Anxiety Difficulty  Extremely difficult Somewhat difficult Somewhat difficult    There are no preventive care reminders to display for this patient.     PMH:  The following were reviewed and entered/updated in epic: Past Medical History:  Diagnosis Date   Anxiety    Depression     Patient Active Problem List   Diagnosis Date Noted   Recurrent viral infection 06/20/2023   Sleep difficulties 06/20/2023   Class 1 obesity without serious comorbidity in adult 05/20/2023   Other fatigue 05/20/2023   Depression, major, single episode, moderate (HCC) 04/14/2022   Acne vulgaris 04/14/2022   Panic anxiety syndrome 04/14/2022    History reviewed. No pertinent surgical history.  History reviewed. No pertinent family history.  Medications- reviewed and updated Outpatient Medications Prior to Visit  Medication Sig Dispense Refill   escitalopram  (LEXAPRO ) 20 MG tablet Take 1 tablet (20 mg total) by mouth daily. 90 tablet 1   buPROPion  (WELLBUTRIN  XL) 300 MG 24 hr tablet Take 1 tablet (300 mg total) by mouth daily. (Patient not taking: Reported on 10/11/2024) 90 tablet 1   No facility-administered medications prior to visit.     Allergies  Allergen Reactions   Aspirin Hives   Augmentin [Amoxicillin-Pot Clavulanate]     Social History   Socioeconomic History   Marital status: Single    Spouse name: Not on file   Number of children: Not on file   Years of education: Not on file   Highest education level: Bachelor's degree (e.g., BA, AB, BS)  Occupational History   Not on file  Tobacco Use   Smoking status: Never    Passive exposure: Never   Smokeless tobacco: Never  Vaping Use   Vaping status: Never Used  Substance and Sexual Activity   Alcohol use: Not Currently    Comment: occ   Drug use: Never   Sexual activity: Never    Birth control/protection: None  Other Topics Concern   Not on file  Social History Narrative   Not on file   Social Drivers of Health   Financial Resource Strain: High Risk (10/11/2024)   Overall Financial Resource Strain (CARDIA)    Difficulty of Paying Living Expenses: Hard  Food Insecurity: Food Insecurity Present (10/11/2024)   Hunger Vital Sign  Worried About Programme Researcher, Broadcasting/film/video in the Last Year: Sometimes true    Ran Out of Food in the Last Year: Sometimes true  Transportation Needs: Unmet Transportation Needs (10/11/2024)   PRAPARE - Administrator, Civil Service (Medical): Yes    Lack of Transportation (Non-Medical): Yes  Physical Activity: Insufficiently Active (10/11/2024)   Exercise Vital Sign    Days of Exercise per Week: 2 days    Minutes of Exercise per Session: 20 min  Stress: Stress Concern Present (10/11/2024)   [employer] of Occupational Health - Occupational Stress Questionnaire    Feeling of Stress: To some extent  Social Connections: Unknown (10/11/2024)   Social Connection and Isolation Panel    Frequency of Communication with Friends and Family: Once a week    Frequency of Social Gatherings with Friends and Family: Once a week    Attends Religious Services: Patient declined    Database Administrator or Organizations:  Patient declined    Attends Banker Meetings: Not on file    Marital Status: Never married  Recent Concern: Social Connections - Socially Isolated (09/06/2024)   Social Connection and Isolation Panel    Frequency of Communication with Friends and Family: Three times a week    Frequency of Social Gatherings with Friends and Family: Twice a week    Attends Religious Services: Never    Database Administrator or Organizations: No    Attends Engineer, Structural: Not on file    Marital Status: Never married           Objective:  Physical Exam: BP 105/70 (BP Location: Left Arm, Patient Position: Sitting, Cuff Size: Large)   Pulse 63   Temp (!) 97 F (36.1 C) (Temporal)   Resp 18   Wt 241 lb 6.4 oz (109.5 kg)   SpO2 96%   BMI 33.67 kg/m   Body mass index is 33.67 kg/m. Wt Readings from Last 3 Encounters:  10/11/24 241 lb 6.4 oz (109.5 kg)  09/06/24 246 lb (111.6 kg)  05/20/23 227 lb 6.4 oz (103.1 kg)    Physical Exam          Physical Exam      Prior labs:   Recent Results (from the past 2160 hours)  TSH Rfx on Abnormal to Free T4     Status: None   Collection Time: 10/11/24 10:19 AM  Result Value Ref Range   TSH 2.210 0.450 - 4.500 uIU/mL  C-peptide     Status: None   Collection Time: 10/11/24 10:19 AM  Result Value Ref Range   C-Peptide 3.38 0.80 - 3.85 ng/mL  Insulin, random     Status: Abnormal   Collection Time: 10/11/24 10:19 AM  Result Value Ref Range   Insulin 19.4 (H) uIU/mL    Comment:       Reference Range  < or = 18.4 .       Risk:       Optimal          < or = 18.4       Moderate         NA       High             >18.4 .       Adult cardiovascular event risk category       cut points (optimal, moderate, high)       are based on Insulin Reference Interval  studies performed at [facility]       in 2022. [name]   Apo A1 + B + Ratio     Status: None   Collection Time: 10/11/24 10:19 AM  Result Value Ref Range    Apolipoprotein A-1 123 101 - 178 mg/dL   Apolipoprotein B 80 <09 mg/dL    Comment:                          Desirable               < 90                          Borderline High     90 -  99                          High               100 - 130                          Very High               >130      --------------------------------------------------           ASCVD RISK              THERAPEUTIC TARGET            CATEGORY                  APO B (mg/dL)         Very High Risk        <80 (if extreme risk <70)         High Risk             <90         Moderate Risk         <90    Apolipo. B/A-1 Ratio 0.7 0.0 - 0.7 ratio    Comment:                              Apolipoprotein B/A-1 Ratio                                            Male  Male                                   Avg.Risk  0.7   0.6                                2X Avg.Risk  0.9   0.9                                3X Avg.Risk  1.0   1.0   CRP High sensitivity     Status: None   Collection Time: 10/11/24 10:19 AM  Result Value Ref Range   CRP, High Sensitivity 0.900 0.000 - 5.000 mg/L    Comment: Note:  An elevated hs-CRP (>5 mg/L) should be repeated after 2 weeks to rule out recent infection or trauma.  Lipid panel     Status: None   Collection Time: 10/11/24 10:19 AM  Result Value Ref Range   Cholesterol 160 0 - 200 mg/dL    Comment: ATP III Classification       Desirable:  < 200 mg/dL               Borderline High:  200 - 239 mg/dL          High:  > = 759 mg/dL   Triglycerides 04.9 0.0 - 149.0 mg/dL    Comment: Normal:  <849 mg/dLBorderline High:  150 - 199 mg/dL   HDL 57.19 >60.99 mg/dL   VLDL 80.9 0.0 - 59.9 mg/dL   LDL Cholesterol 98 0 - 99 mg/dL   Total CHOL/HDL Ratio 4     Comment:                Men          Women1/2 Average Risk     3.4          3.3Average Risk          5.0          4.42X Average Risk          9.6          7.13X Average Risk          15.0          11.0                       NonHDL 116.85      Comment: NOTE:  Non-HDL goal should be 30 mg/dL higher than patient's LDL goal (i.e. LDL goal of < 70 mg/dL, would have non-HDL goal of < 100 mg/dL)  Hemoglobin J8r     Status: None   Collection Time: 10/11/24 10:19 AM  Result Value Ref Range   Hgb A1c MFr Bld 5.1 4.6 - 6.5 %    Comment: Glycemic Control Guidelines for People with Diabetes:Non Diabetic:  <6%Goal of Therapy: <7%Additional Action Suggested:  >8%   Microalbumin / creatinine urine ratio     Status: None   Collection Time: 10/11/24 10:19 AM  Result Value Ref Range   Microalb, Ur 0.7 0.0 - 1.9 mg/dL   Creatinine,U 752.9 mg/dL   Microalb Creat Ratio 3.0 0.0 - 30.0 mg/g  CBC with Differential/Platelet     Status: None   Collection Time: 10/11/24 10:19 AM  Result Value Ref Range   WBC 6.8 4.0 - 10.5 K/uL   RBC 5.48 4.22 - 5.81 Mil/uL   Hemoglobin 15.9 13.0 - 17.0 g/dL   HCT 53.7 60.9 - 47.9 %   MCV 84.3 78.0 - 100.0 fl   MCHC 34.5 30.0 - 36.0 g/dL   RDW 87.0 88.4 - 84.4 %   Platelets 200.0 150.0 - 400.0 K/uL   Neutrophils Relative % 61.8 43.0 - 77.0 %   Lymphocytes Relative 29.1 12.0 - 46.0 %   Monocytes Relative 6.9 3.0 - 12.0 %   Eosinophils Relative 2.0 0.0 - 5.0 %   Basophils Relative 0.2 0.0 - 3.0 %   Neutro Abs 4.2 1.4 - 7.7 K/uL   Lymphs Abs 2.0 0.7 - 4.0 K/uL   Monocytes Absolute 0.5 0.1 - 1.0 K/uL   Eosinophils Absolute 0.1 0.0 - 0.7 K/uL   Basophils Absolute 0.0 0.0 -  0.1 K/uL  Comprehensive metabolic panel with GFR     Status: None   Collection Time: 10/11/24 10:19 AM  Result Value Ref Range   Sodium 137 135 - 145 mEq/L   Potassium 4.3 3.5 - 5.1 mEq/L   Chloride 101 96 - 112 mEq/L   CO2 29 19 - 32 mEq/L    Comment: Elevated LDH levels may cause falsely increased CO2 results. If LDH is >2000 U/L, a positive bias of 12% is possible.   Glucose, Bld 86 70 - 99 mg/dL   BUN 19 6 - 23 mg/dL   Creatinine, Ser 8.88 0.40 - 1.50 mg/dL   Total Bilirubin 0.6 0.2 - 1.2 mg/dL   Alkaline Phosphatase 54 39 - 117 U/L    AST 13 0 - 37 U/L   ALT 14 0 - 53 U/L   Total Protein 7.9 6.0 - 8.3 g/dL   Albumin 5.0 3.5 - 5.2 g/dL   GFR 08.99 >39.99 mL/min    Comment: Calculated using the CKD-EPI Creatinine Equation (2021)   Calcium 9.6 8.4 - 10.5 mg/dL  Urinalysis w microscopic + reflex cultur     Status: None   Collection Time: 10/11/24 10:19 AM   Specimen: Serum  Result Value Ref Range   Color, Urine YELLOW YELLOW   APPearance CLEAR CLEAR   Specific Gravity, Urine 1.027 1.001 - 1.035   pH 5.5 5.0 - 8.0   Glucose, UA NEGATIVE NEGATIVE   Bilirubin Urine NEGATIVE NEGATIVE   Ketones, ur NEGATIVE NEGATIVE   Hgb urine dipstick NEGATIVE NEGATIVE   Protein, ur NEGATIVE NEGATIVE   Nitrites, Initial NEGATIVE NEGATIVE   Leukocyte Esterase NEGATIVE NEGATIVE   WBC, UA NONE SEEN 0 - 5 /HPF   RBC / HPF NONE SEEN 0 - 2 /HPF   Squamous Epithelial / HPF NONE SEEN < OR = 5 /HPF   Bacteria, UA NONE SEEN NONE SEEN /HPF   Hyaline Cast NONE SEEN NONE SEEN /LPF   Note      Comment: This urine was analyzed for the presence of WBC,  RBC, bacteria, casts, and other formed elements.  Only those elements seen were reported. . .   Iron, TIBC and Ferritin Panel     Status: None (Preliminary result)   Collection Time: 10/11/24 10:19 AM  Result Value Ref Range   Iron 97 50 - 195 mcg/dL   TIBC 646 749 - 574 mcg/dL (calc)   %SAT 27 20 - 48 % (calc)  B12 and Folate Panel     Status: None   Collection Time: 10/11/24 10:19 AM  Result Value Ref Range   Vitamin B-12 245 211 - 911 pg/mL   Folate 8.1 >5.9 ng/mL  Lyme Disease Serology w/Reflex     Status: None   Collection Time: 10/11/24 10:19 AM  Result Value Ref Range   Lyme Total Antibody EIA Negative Negative    Comment: Lyme antibodies not detected. Reflex testing is not indicated. No laboratory evidence of infection with B. burgdorferi (Lyme disease). Negative results may occur in patients recently infected (less than or equal to 14 days) with B. burgdorferi.  If recent  infection is suspected, repeat testing on a new sample collected in 7 to 14 days is recommended.   REFLEXIVE URINE CULTURE     Status: None   Collection Time: 10/11/24 10:19 AM  Result Value Ref Range   Reflexve Urine Culture      Comment: NO CULTURE INDICATED    Lab Results  Component  Value Date   CHOL 160 10/11/2024   CHOL 170 05/20/2023   CHOL 162 04/14/2022   Lab Results  Component Value Date   HDL 42.80 10/11/2024   HDL 45.40 05/20/2023   HDL 35.90 (L) 04/14/2022   Lab Results  Component Value Date   LDLCALC 98 10/11/2024   LDLCALC 109 (H) 05/20/2023   LDLCALC 99 04/14/2022   Lab Results  Component Value Date   TRIG 95.0 10/11/2024   TRIG 77.0 05/20/2023   TRIG 132.0 04/14/2022   Lab Results  Component Value Date   CHOLHDL 4 10/11/2024   CHOLHDL 4 05/20/2023   CHOLHDL 5 04/14/2022   No results found for: LDLDIRECT  Last metabolic panel Lab Results  Component Value Date   GLUCOSE 86 10/11/2024   NA 137 10/11/2024   K 4.3 10/11/2024   CL 101 10/11/2024   CO2 29 10/11/2024   BUN 19 10/11/2024   CREATININE 1.11 10/11/2024   GFR 91.00 10/11/2024   CALCIUM 9.6 10/11/2024   PROT 7.9 10/11/2024   ALBUMIN 5.0 10/11/2024   BILITOT 0.6 10/11/2024   ALKPHOS 54 10/11/2024   AST 13 10/11/2024   ALT 14 10/11/2024   ANIONGAP 6 02/01/2017    Lab Results  Component Value Date   HGBA1C 5.1 10/11/2024    Last CBC Lab Results  Component Value Date   WBC 6.8 10/11/2024   HGB 15.9 10/11/2024   HCT 46.2 10/11/2024   MCV 84.3 10/11/2024   MCH 29.7 02/01/2017   RDW 12.9 10/11/2024   PLT 200.0 10/11/2024    Lab Results  Component Value Date   TSH 2.210 10/11/2024    No results found for: PSA1, PSA  Last vitamin D  No results found for: MARIEN BOLLS, VD25OH  Lab Results  Component Value Date   BILIRUBINUR NEGATIVE 05/20/2023   PROTEINUR NEGATIVE 10/11/2024   UROBILINOGEN 0.2 05/20/2023   LEUKOCYTESUR NEGATIVE 05/20/2023     Lab Results  Component Value Date   MICROALBUR 0.7 10/11/2024    At today's visit, we discussed treatment options, associated risk and benefits, and engage in counseling as needed.  Additionally the following were reviewed: Past medical records, past medical and surgical history, family and social background, as well as relevant laboratory results, imaging findings, and specialty notes, where applicable.   This message was generated using dictation software, and as a result, it may contain unintentional typos or errors.  Nevertheless, extensive effort was made to accurately convey at the pertinent aspects of the patient visit.     There may have been are other unrelated non-urgent complaints, but due to the busy schedule and the amount of time already spent with him, time does not permit to address these issues at today's visit. Another appointment may have or has been requested to review these additional issues.      Beverley KATHEE Hummer, MD  I,Emily Lagle,acting as a scribe for Beverley KATHEE Hummer, MD.,have documented all relevant documentation on the behalf of Beverley KATHEE Hummer, MD,as directed by  Beverley KATHEE Hummer, MD while in the presence of Beverley KATHEE Hummer, MD.   I, Beverley KATHEE Hummer, MD, have reviewed all documentation for this visit. The documentation on 10/11/2024 for the exam, diagnosis, procedures, and orders are all accurate and complete.

## 2024-10-11 NOTE — Patient Instructions (Addendum)
 It was very nice to see you today!  VISIT SUMMARY: Today, you had your annual physical and follow-up for depression. We discussed your current symptoms, medication side effects, and concerns about a recent tick bite. We also reviewed your overall health, including weight, cholesterol levels, and skin condition.  YOUR PLAN: DEPRESSION WITH INSOMNIA: Your depression and sleep issues have partially improved with your current medication, but you are still experiencing some symptoms and side effects. -Start taking mirtazapine 7.5 mg for one week, then increase to 15 mg if tolerated. -Follow up in one month to assess mood and sleep improvement.  PANIC DISORDER: Your anxiety symptoms have improved significantly. -Continue current management.  OBESITY: Your BMI is 33, which increases your risk for metabolic syndrome disorders. -Encourage regular exercise and healthy lifestyle modifications.  HYPERLIPIDEMIA: You have mild hyperlipidemia, which was noted in past lab work. -Order fasting lipid panel, Apo B, Apo A plus ratio, and high sensitivity CRP for cardiovascular risk assessment.  ACNE VULGARIS: You have a history of acne and need a new dermatology referral. -Refer to dermatologist Erminio Goods.  ADULT WELLNESS VISIT: We conducted your annual physical examination and discussed the importance of maintaining a healthy lifestyle. -Order fasting lipid panel, Apo B, Apo A plus ratio, and high sensitivity CRP for cardiovascular risk assessment. -Order fasting insulin, hemoglobin A1c, and C-peptide to screen for hyperglycemic disease and diabetes. -Order GFR with complete metabolic panel, urinalysis plus micro with reflex culture, and microalbumin creatinine ratio to screen for renal dysfunction. -Order LFTs with CMP and CBC with platelet to assess for fatty liver disease and calculate FIB4. -Order TSH with Reflex to free T4 to assess for thyroid  dysfunction. -Order iron levels, B12, folic acid,  and vitamin D  to check for deficiencies that may affect mood. -Order Lyme titer due to recent tick bite and family history of Lyme disease.  Return in about 1 month (around 11/11/2024).   Take care, Arvella Hummer, MD, MS   PLEASE NOTE:  If you had any lab tests, please let us  know if you have not heard back within a few days. You may see your results on mychart before we have a chance to review them but we will give you a call once they are reviewed by us .   If we ordered any referrals today, please let us  know if you have not heard from their office within the next week.   If you had any urgent prescriptions sent in today, please check with the pharmacy within an hour of our visit to make sure the prescription was transmitted appropriately.   Please try these tips to maintain a healthy lifestyle:  Eat at least 3 REAL meals and 1-2 snacks per day.  Aim for no more than 5 hours between eating.  If you eat breakfast, please do so within one hour of getting up.   Each meal should contain half fruits/vegetables, one quarter protein, and one quarter carbs (no bigger than a computer mouse)  Cut down on sweet beverages. This includes juice, soda, and sweet tea.   Drink at least 1 glass of water with each meal and aim for at least 8 glasses per day  Exercise at least 150 minutes every week.

## 2024-10-12 LAB — TSH RFX ON ABNORMAL TO FREE T4: TSH: 2.21 u[IU]/mL (ref 0.450–4.500)

## 2024-10-12 LAB — APO A1 + B + RATIO
Apolipo. B/A-1 Ratio: 0.7 ratio (ref 0.0–0.7)
Apolipoprotein A-1: 123 mg/dL (ref 101–178)
Apolipoprotein B: 80 mg/dL (ref <90)

## 2024-10-12 LAB — LYME DISEASE SEROLOGY W/REFLEX: Lyme Total Antibody EIA: NEGATIVE

## 2024-10-12 LAB — HEMOGLOBIN A1C: Hgb A1c MFr Bld: 5.1 % (ref 4.6–6.5)

## 2024-10-14 ENCOUNTER — Ambulatory Visit: Payer: Self-pay | Admitting: Family Medicine

## 2024-10-14 NOTE — Progress Notes (Signed)
 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 Anxiety improved (GAD-7: 17 ? 9). Plan: Start mirtazapine 7.5 mg nightly 1 week, then increase to 15 mg if tolerated Follow up in 1 month Orders: Iron/TIBC/Ferritin, B12/Folate, Vitamin D     Class 1 Obesity (BMI 33)  Counsel on diet/exercise; screen for metabolic risk. Orders: TSH, C-peptide, insulin, A1c, lipid panel, Apo A/B ratio, hs-CRP, CMP/GFR, CBC, UA, microalbumin    Hyperlipidemia (Mild)  Cardiovascular risk assessment. Orders: Included in obesity panel    Acne Vulgaris  Refer to dermatologist Erminio Goods    Tick Bite - Lyme Concern  Order Lyme serology w/ reflex    Adult Wellness Visit  Annual physical completed; reinforce healthy lifestyle. Orders: Overlap with obesity/hyperlipidemia + vitamin deficiency panel    There are no discontinued medications.  Patient Counseling(The following topics were reviewed and/or handout was given):  -Nutrition: Stressed importance of moderation in sodium/caffeine intake, saturated fat and cholesterol, caloric balance, sufficient intake of fresh fruits, vegetables, and fiber.  -Stressed the importance of regular exercise.   -Substance Abuse: Discussed cessation/primary prevention of tobacco, alcohol, or other drug use; driving or other dangerous activities under the influence; availability of treatment for abuse.   -Injury prevention: Discussed safety belts, safety helmets, smoke detector, smoking near bedding or upholstery.   -Sexuality: Discussed sexually transmitted diseases, partner selection, use of condoms, avoidance of unintended pregnancy and contraceptive alternatives.   -Dental health: Discussed importance of regular tooth brushing, flossing, and dental visits.  -Health maintenance and immunizations reviewed. Please refer to Health  maintenance section.  Return in about 1 month (around 11/11/2024) for Depression, anxiety, insomnia.        Subjective:   Encounter date: 10/11/2024  Chief Complaint  Patient presents with   Annual Exam    Pt is fasting today  HM due- flu vaccine (Pt declined)    Acne    Pt c/o of acne on back, shoulder and face; requesting dermatology referral to [facility])    Anxiety    Rx refill request for lexapro  20MG      Discussed the use of AI scribe software for clinical note transcription with the patient, who gave verbal consent to proceed.  History of Present Illness Miguel Dunn, 28 year old male, presents for annual physical and depression follow-up.  Depression/Anxiety: Restarted Lexapro  20 mg daily on 09/06/2024 after prior discontinuation due to insurance issues. GAD-7 improved from 17 ? 9; PHQ-9 from 14 ? 11, though symptom difficulty increased to "very difficult." Reports situational stress related to life events and mother's health. Sleep disturbance persists but improved (1-2 nights/week). No chest pain, SOB, or palpitations. Medication Effects: Lexapro  helpful for mood but causes persistent daytime nausea (limits eating until dinner) and increased nighttime sweet cravings. Previously trialed bupropion  (helpful, no side effects) but discontinued due to insurance. Obesity/Hyperlipidemia: BMI 33; history of mild hyperlipidemia with prior mildly elevated cholesterol. Acne Vulgaris: Referred to dermatology on 09/06/2024; appointment delayed. Requests referral to [facility] Tick Bite/Lyme Concern: Tick bite occurred a few months ago; concerned due to maternal history of Lyme disease. Previously tested 1-2 years ago.        10/11/2024    9:13 AM 10/11/2024    9:03 AM 09/06/2024   10:01 AM 06/20/2023   10:42 AM 05/20/2023    9:47 AM  Depression screen PHQ 2/9  Decreased Interest 2 0 3 1 3  Down, Depressed, Hopeless 2 0 2 3 3   PHQ - 2 Score 4 0 5  4 6   Altered sleeping 1  2 1 2   Tired, decreased energy 3  3 3 3   Change in appetite 0  0 0 1  Feeling bad or failure about yourself  2  3 2 3   Trouble concentrating 1  1 2 3   Moving slowly or fidgety/restless 0  0 0 0  Suicidal thoughts 0  0 0 0  PHQ-9 Score 11  14 12 18   Difficult doing work/chores Very difficult  Somewhat difficult Very difficult Extremely dIfficult       10/11/2024    9:14 AM 09/06/2024   10:01 AM 06/20/2023   10:44 AM 05/20/2023    9:47 AM  GAD 7 : Generalized Anxiety Score  Nervous, Anxious, on Edge 2 3 1 1   Control/stop worrying 1 3 2 1   Worry too much - different things 2 3 2 1   Trouble relaxing 1 2 0 1  Restless 0 1 0 0  Easily annoyed or irritable 2 2 1 2   Afraid - awful might happen 1 3 1 2   Total GAD 7 Score 9 17 7 8   Anxiety Difficulty  Extremely difficult Somewhat difficult Somewhat difficult    There are no preventive care reminders to display for this patient.     PMH:  The following were reviewed and entered/updated in epic: Past Medical History:  Diagnosis Date   Anxiety    Depression     Patient Active Problem List   Diagnosis Date Noted   Recurrent viral infection 06/20/2023   Sleep difficulties 06/20/2023   Class 1 obesity without serious comorbidity in adult 05/20/2023   Other fatigue 05/20/2023   Depression, major, single episode, moderate (HCC) 04/14/2022   Acne vulgaris 04/14/2022   Panic anxiety syndrome 04/14/2022    History reviewed. No pertinent surgical history.  History reviewed. No pertinent family history.  Medications- reviewed and updated Outpatient Medications Prior to Visit  Medication Sig Dispense Refill   escitalopram  (LEXAPRO ) 20 MG tablet Take 1 tablet (20 mg total) by mouth daily. 90 tablet 1   buPROPion  (WELLBUTRIN  XL) 300 MG 24 hr tablet Take 1 tablet (300 mg total) by mouth daily. (Patient not taking: Reported on 10/11/2024) 90 tablet 1   No facility-administered medications prior to visit.     Allergies  Allergen Reactions   Aspirin Hives   Augmentin [Amoxicillin-Pot Clavulanate]     Social History   Socioeconomic History   Marital status: Single    Spouse name: Not on file   Number of children: Not on file   Years of education: Not on file   Highest education level: Bachelor's degree (e.g., BA, AB, BS)  Occupational History   Not on file  Tobacco Use   Smoking status: Never    Passive exposure: Never   Smokeless tobacco: Never  Vaping Use   Vaping status: Never Used  Substance and Sexual Activity   Alcohol use: Not Currently    Comment: occ   Drug use: Never   Sexual activity: Never    Birth control/protection: None  Other Topics Concern   Not on file  Social History Narrative   Not on file   Social Drivers of Health   Financial Resource Strain: High Risk (10/11/2024)   Overall Financial Resource Strain (CARDIA)    Difficulty of Paying Living Expenses: Hard  Food Insecurity: Food Insecurity Present (10/11/2024)   Hunger Vital Sign  Worried About Programme Researcher, Broadcasting/film/video in the Last Year: Sometimes true    Ran Out of Food in the Last Year: Sometimes true  Transportation Needs: Unmet Transportation Needs (10/11/2024)   PRAPARE - Administrator, Civil Service (Medical): Yes    Lack of Transportation (Non-Medical): Yes  Physical Activity: Insufficiently Active (10/11/2024)   Exercise Vital Sign    Days of Exercise per Week: 2 days    Minutes of Exercise per Session: 20 min  Stress: Stress Concern Present (10/11/2024)   [employer] of Occupational Health - Occupational Stress Questionnaire    Feeling of Stress: To some extent  Social Connections: Unknown (10/11/2024)   Social Connection and Isolation Panel    Frequency of Communication with Friends and Family: Once a week    Frequency of Social Gatherings with Friends and Family: Once a week    Attends Religious Services: Patient declined    Database Administrator or Organizations:  Patient declined    Attends Banker Meetings: Not on file    Marital Status: Never married  Recent Concern: Social Connections - Socially Isolated (09/06/2024)   Social Connection and Isolation Panel    Frequency of Communication with Friends and Family: Three times a week    Frequency of Social Gatherings with Friends and Family: Twice a week    Attends Religious Services: Never    Database Administrator or Organizations: No    Attends Engineer, Structural: Not on file    Marital Status: Never married           Objective:  Physical Exam: BP 105/70 (BP Location: Left Arm, Patient Position: Sitting, Cuff Size: Large)   Pulse 63   Temp (!) 97 F (36.1 C) (Temporal)   Resp 18   Wt 241 lb 6.4 oz (109.5 kg)   SpO2 96%   BMI 33.67 kg/m   Body mass index is 33.67 kg/m. Wt Readings from Last 3 Encounters:  10/11/24 241 lb 6.4 oz (109.5 kg)  09/06/24 246 lb (111.6 kg)  05/20/23 227 lb 6.4 oz (103.1 kg)    Physical Exam          Physical Exam      Prior labs:   Recent Results (from the past 2160 hours)  TSH Rfx on Abnormal to Free T4     Status: None   Collection Time: 10/11/24 10:19 AM  Result Value Ref Range   TSH 2.210 0.450 - 4.500 uIU/mL  C-peptide     Status: None   Collection Time: 10/11/24 10:19 AM  Result Value Ref Range   C-Peptide 3.38 0.80 - 3.85 ng/mL  Insulin, random     Status: Abnormal   Collection Time: 10/11/24 10:19 AM  Result Value Ref Range   Insulin 19.4 (H) uIU/mL    Comment:       Reference Range  < or = 18.4 .       Risk:       Optimal          < or = 18.4       Moderate         NA       High             >18.4 .       Adult cardiovascular event risk category       cut points (optimal, moderate, high)       are based on Insulin Reference Interval  studies performed at [facility]       in 2022. [name]   Apo A1 + B + Ratio     Status: None   Collection Time: 10/11/24 10:19 AM  Result Value Ref Range    Apolipoprotein A-1 123 101 - 178 mg/dL   Apolipoprotein B 80 <09 mg/dL    Comment:                          Desirable               < 90                          Borderline High     90 -  99                          High               100 - 130                          Very High               >130      --------------------------------------------------           ASCVD RISK              THERAPEUTIC TARGET            CATEGORY                  APO B (mg/dL)         Very High Risk        <80 (if extreme risk <70)         High Risk             <90         Moderate Risk         <90    Apolipo. B/A-1 Ratio 0.7 0.0 - 0.7 ratio    Comment:                              Apolipoprotein B/A-1 Ratio                                            Male  Male                                   Avg.Risk  0.7   0.6                                2X Avg.Risk  0.9   0.9                                3X Avg.Risk  1.0   1.0   CRP High sensitivity     Status: None   Collection Time: 10/11/24 10:19 AM  Result Value Ref Range   CRP, High Sensitivity 0.900 0.000 - 5.000 mg/L    Comment: Note:  An elevated hs-CRP (>5 mg/L) should be repeated after 2 weeks to rule out recent infection or trauma.  Lipid panel     Status: None   Collection Time: 10/11/24 10:19 AM  Result Value Ref Range   Cholesterol 160 0 - 200 mg/dL    Comment: ATP III Classification       Desirable:  < 200 mg/dL               Borderline High:  200 - 239 mg/dL          High:  > = 759 mg/dL   Triglycerides 04.9 0.0 - 149.0 mg/dL    Comment: Normal:  <849 mg/dLBorderline High:  150 - 199 mg/dL   HDL 57.19 >60.99 mg/dL   VLDL 80.9 0.0 - 59.9 mg/dL   LDL Cholesterol 98 0 - 99 mg/dL   Total CHOL/HDL Ratio 4     Comment:                Men          Women1/2 Average Risk     3.4          3.3Average Risk          5.0          4.42X Average Risk          9.6          7.13X Average Risk          15.0          11.0                       NonHDL 116.85      Comment: NOTE:  Non-HDL goal should be 30 mg/dL higher than patient's LDL goal (i.e. LDL goal of < 70 mg/dL, would have non-HDL goal of < 100 mg/dL)  Hemoglobin J8r     Status: None   Collection Time: 10/11/24 10:19 AM  Result Value Ref Range   Hgb A1c MFr Bld 5.1 4.6 - 6.5 %    Comment: Glycemic Control Guidelines for People with Diabetes:Non Diabetic:  <6%Goal of Therapy: <7%Additional Action Suggested:  >8%   Microalbumin / creatinine urine ratio     Status: None   Collection Time: 10/11/24 10:19 AM  Result Value Ref Range   Microalb, Ur 0.7 0.0 - 1.9 mg/dL   Creatinine,U 752.9 mg/dL   Microalb Creat Ratio 3.0 0.0 - 30.0 mg/g  CBC with Differential/Platelet     Status: None   Collection Time: 10/11/24 10:19 AM  Result Value Ref Range   WBC 6.8 4.0 - 10.5 K/uL   RBC 5.48 4.22 - 5.81 Mil/uL   Hemoglobin 15.9 13.0 - 17.0 g/dL   HCT 53.7 60.9 - 47.9 %   MCV 84.3 78.0 - 100.0 fl   MCHC 34.5 30.0 - 36.0 g/dL   RDW 87.0 88.4 - 84.4 %   Platelets 200.0 150.0 - 400.0 K/uL   Neutrophils Relative % 61.8 43.0 - 77.0 %   Lymphocytes Relative 29.1 12.0 - 46.0 %   Monocytes Relative 6.9 3.0 - 12.0 %   Eosinophils Relative 2.0 0.0 - 5.0 %   Basophils Relative 0.2 0.0 - 3.0 %   Neutro Abs 4.2 1.4 - 7.7 K/uL   Lymphs Abs 2.0 0.7 - 4.0 K/uL   Monocytes Absolute 0.5 0.1 - 1.0 K/uL   Eosinophils Absolute 0.1 0.0 - 0.7 K/uL   Basophils Absolute 0.0 0.0 -  0.1 K/uL  Comprehensive metabolic panel with GFR     Status: None   Collection Time: 10/11/24 10:19 AM  Result Value Ref Range   Sodium 137 135 - 145 mEq/L   Potassium 4.3 3.5 - 5.1 mEq/L   Chloride 101 96 - 112 mEq/L   CO2 29 19 - 32 mEq/L    Comment: Elevated LDH levels may cause falsely increased CO2 results. If LDH is >2000 U/L, a positive bias of 12% is possible.   Glucose, Bld 86 70 - 99 mg/dL   BUN 19 6 - 23 mg/dL   Creatinine, Ser 8.88 0.40 - 1.50 mg/dL   Total Bilirubin 0.6 0.2 - 1.2 mg/dL   Alkaline Phosphatase 54 39 - 117 U/L    AST 13 0 - 37 U/L   ALT 14 0 - 53 U/L   Total Protein 7.9 6.0 - 8.3 g/dL   Albumin 5.0 3.5 - 5.2 g/dL   GFR 08.99 >39.99 mL/min    Comment: Calculated using the CKD-EPI Creatinine Equation (2021)   Calcium 9.6 8.4 - 10.5 mg/dL  Urinalysis w microscopic + reflex cultur     Status: None   Collection Time: 10/11/24 10:19 AM   Specimen: Serum  Result Value Ref Range   Color, Urine YELLOW YELLOW   APPearance CLEAR CLEAR   Specific Gravity, Urine 1.027 1.001 - 1.035   pH 5.5 5.0 - 8.0   Glucose, UA NEGATIVE NEGATIVE   Bilirubin Urine NEGATIVE NEGATIVE   Ketones, ur NEGATIVE NEGATIVE   Hgb urine dipstick NEGATIVE NEGATIVE   Protein, ur NEGATIVE NEGATIVE   Nitrites, Initial NEGATIVE NEGATIVE   Leukocyte Esterase NEGATIVE NEGATIVE   WBC, UA NONE SEEN 0 - 5 /HPF   RBC / HPF NONE SEEN 0 - 2 /HPF   Squamous Epithelial / HPF NONE SEEN < OR = 5 /HPF   Bacteria, UA NONE SEEN NONE SEEN /HPF   Hyaline Cast NONE SEEN NONE SEEN /LPF   Note      Comment: This urine was analyzed for the presence of WBC,  RBC, bacteria, casts, and other formed elements.  Only those elements seen were reported. . .   Iron, TIBC and Ferritin Panel     Status: None (Preliminary result)   Collection Time: 10/11/24 10:19 AM  Result Value Ref Range   Iron 97 50 - 195 mcg/dL   TIBC 646 749 - 574 mcg/dL (calc)   %SAT 27 20 - 48 % (calc)  B12 and Folate Panel     Status: None   Collection Time: 10/11/24 10:19 AM  Result Value Ref Range   Vitamin B-12 245 211 - 911 pg/mL   Folate 8.1 >5.9 ng/mL  Lyme Disease Serology w/Reflex     Status: None   Collection Time: 10/11/24 10:19 AM  Result Value Ref Range   Lyme Total Antibody EIA Negative Negative    Comment: Lyme antibodies not detected. Reflex testing is not indicated. No laboratory evidence of infection with B. burgdorferi (Lyme disease). Negative results may occur in patients recently infected (less than or equal to 14 days) with B. burgdorferi.  If recent  infection is suspected, repeat testing on a new sample collected in 7 to 14 days is recommended.   REFLEXIVE URINE CULTURE     Status: None   Collection Time: 10/11/24 10:19 AM  Result Value Ref Range   Reflexve Urine Culture      Comment: NO CULTURE INDICATED    Lab Results  Component  Value Date   CHOL 160 10/11/2024   CHOL 170 05/20/2023   CHOL 162 04/14/2022   Lab Results  Component Value Date   HDL 42.80 10/11/2024   HDL 45.40 05/20/2023   HDL 35.90 (L) 04/14/2022   Lab Results  Component Value Date   LDLCALC 98 10/11/2024   LDLCALC 109 (H) 05/20/2023   LDLCALC 99 04/14/2022   Lab Results  Component Value Date   TRIG 95.0 10/11/2024   TRIG 77.0 05/20/2023   TRIG 132.0 04/14/2022   Lab Results  Component Value Date   CHOLHDL 4 10/11/2024   CHOLHDL 4 05/20/2023   CHOLHDL 5 04/14/2022   No results found for: LDLDIRECT  Last metabolic panel Lab Results  Component Value Date   GLUCOSE 86 10/11/2024   NA 137 10/11/2024   K 4.3 10/11/2024   CL 101 10/11/2024   CO2 29 10/11/2024   BUN 19 10/11/2024   CREATININE 1.11 10/11/2024   GFR 91.00 10/11/2024   CALCIUM 9.6 10/11/2024   PROT 7.9 10/11/2024   ALBUMIN 5.0 10/11/2024   BILITOT 0.6 10/11/2024   ALKPHOS 54 10/11/2024   AST 13 10/11/2024   ALT 14 10/11/2024   ANIONGAP 6 02/01/2017    Lab Results  Component Value Date   HGBA1C 5.1 10/11/2024    Last CBC Lab Results  Component Value Date   WBC 6.8 10/11/2024   HGB 15.9 10/11/2024   HCT 46.2 10/11/2024   MCV 84.3 10/11/2024   MCH 29.7 02/01/2017   RDW 12.9 10/11/2024   PLT 200.0 10/11/2024    Lab Results  Component Value Date   TSH 2.210 10/11/2024    No results found for: PSA1, PSA  Last vitamin D  No results found for: MARIEN BOLLS, VD25OH  Lab Results  Component Value Date   BILIRUBINUR NEGATIVE 05/20/2023   PROTEINUR NEGATIVE 10/11/2024   UROBILINOGEN 0.2 05/20/2023   LEUKOCYTESUR NEGATIVE 05/20/2023     Lab Results  Component Value Date   MICROALBUR 0.7 10/11/2024    At today's visit, we discussed treatment options, associated risk and benefits, and engage in counseling as needed.  Additionally the following were reviewed: Past medical records, past medical and surgical history, family and social background, as well as relevant laboratory results, imaging findings, and specialty notes, where applicable.   This message was generated using dictation software, and as a result, it may contain unintentional typos or errors.  Nevertheless, extensive effort was made to accurately convey at the pertinent aspects of the patient visit.     There may have been are other unrelated non-urgent complaints, but due to the busy schedule and the amount of time already spent with him, time does not permit to address these issues at today's visit. Another appointment may have or has been requested to review these additional issues.      Beverley KATHEE Hummer, MD  I,Emily Lagle,acting as a scribe for Beverley KATHEE Hummer, MD.,have documented all relevant documentation on the behalf of Beverley KATHEE Hummer, MD,as directed by  Beverley KATHEE Hummer, MD while in the presence of Beverley KATHEE Hummer, MD.   I, Beverley KATHEE Hummer, MD, have reviewed all documentation for this visit. The documentation on 10/11/2024 for the exam, diagnosis, procedures, and orders are all accurate and complete.

## 2024-10-14 NOTE — Progress Notes (Signed)
 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 Anxiety improved (GAD-7: 17 ? 9). Plan: Start mirtazapine 7.5 mg nightly 1 week, then increase to 15 mg if tolerated Follow up in 1 month Orders: Iron/TIBC/Ferritin, B12/Folate, Vitamin D     Class 1 Obesity (BMI 33)  Counsel on diet/exercise; screen for metabolic risk. Orders: TSH, C-peptide, insulin, A1c, lipid panel, Apo A/B ratio, hs-CRP, CMP/GFR, CBC, UA, microalbumin    Hyperlipidemia (Mild)  Cardiovascular risk assessment. Orders: Included in obesity panel    Acne Vulgaris  Refer to dermatologist Erminio Goods    Tick Bite - Lyme Concern  Order Lyme serology w/ reflex    Adult Wellness Visit  Annual physical completed; reinforce healthy lifestyle. Orders: Overlap with obesity/hyperlipidemia + vitamin deficiency panel    There are no discontinued medications.  Patient Counseling(The following topics were reviewed and/or handout was given):  -Nutrition: Stressed importance of moderation in sodium/caffeine intake, saturated fat and cholesterol, caloric balance, sufficient intake of fresh fruits, vegetables, and fiber.  -Stressed the importance of regular exercise.   -Substance Abuse: Discussed cessation/primary prevention of tobacco, alcohol, or other drug use; driving or other dangerous activities under the influence; availability of treatment for abuse.   -Injury prevention: Discussed safety belts, safety helmets, smoke detector, smoking near bedding or upholstery.   -Sexuality: Discussed sexually transmitted diseases, partner selection, use of condoms, avoidance of unintended pregnancy and contraceptive alternatives.   -Dental health: Discussed importance of regular tooth brushing, flossing, and dental visits.  -Health maintenance and immunizations reviewed. Please refer to Health  maintenance section.  Return in about 1 month (around 11/11/2024) for Depression, anxiety, insomnia.        Subjective:   Encounter date: 10/11/2024  Chief Complaint  Patient presents with   Annual Exam    Pt is fasting today  HM due- flu vaccine (Pt declined)    Acne    Pt c/o of acne on back, shoulder and face; requesting dermatology referral to [facility])    Anxiety    Rx refill request for lexapro  20MG      Discussed the use of AI scribe software for clinical note transcription with the patient, who gave verbal consent to proceed.  History of Present Illness Miguel Dunn, 27 year old male, presents for annual physical and depression follow-up.  Depression/Anxiety: Restarted Lexapro  20 mg daily on 09/06/2024 after prior discontinuation due to insurance issues. GAD-7 improved from 17 ? 9; PHQ-9 from 14 ? 11, though symptom difficulty increased to "very difficult." Reports situational stress related to life events and mother's health. Sleep disturbance persists but improved (1-2 nights/week). No chest pain, SOB, or palpitations. Medication Effects: Lexapro  helpful for mood but causes persistent daytime nausea (limits eating until dinner) and increased nighttime sweet cravings. Previously trialed bupropion  (helpful, no side effects) but discontinued due to insurance. Obesity/Hyperlipidemia: BMI 33; history of mild hyperlipidemia with prior mildly elevated cholesterol. Acne Vulgaris: Referred to dermatology on 09/06/2024; appointment delayed. Requests referral to [facility] Tick Bite/Lyme Concern: Tick bite occurred a few months ago; concerned due to maternal history of Lyme disease. Previously tested 1-2 years ago.        10/11/2024    9:13 AM 10/11/2024    9:03 AM 09/06/2024   10:01 AM 06/20/2023   10:42 AM 05/20/2023    9:47 AM  Depression screen PHQ 2/9  Decreased Interest 2 0 3 1 3  Down, Depressed, Hopeless 2 0 2 3 3   PHQ - 2 Score 4 0 5  4 6   Altered sleeping 1  2 1 2   Tired, decreased energy 3  3 3 3   Change in appetite 0  0 0 1  Feeling bad or failure about yourself  2  3 2 3   Trouble concentrating 1  1 2 3   Moving slowly or fidgety/restless 0  0 0 0  Suicidal thoughts 0  0 0 0  PHQ-9 Score 11  14 12 18   Difficult doing work/chores Very difficult  Somewhat difficult Very difficult Extremely dIfficult       10/11/2024    9:14 AM 09/06/2024   10:01 AM 06/20/2023   10:44 AM 05/20/2023    9:47 AM  GAD 7 : Generalized Anxiety Score  Nervous, Anxious, on Edge 2 3 1 1   Control/stop worrying 1 3 2 1   Worry too much - different things 2 3 2 1   Trouble relaxing 1 2 0 1  Restless 0 1 0 0  Easily annoyed or irritable 2 2 1 2   Afraid - awful might happen 1 3 1 2   Total GAD 7 Score 9 17 7 8   Anxiety Difficulty  Extremely difficult Somewhat difficult Somewhat difficult    There are no preventive care reminders to display for this patient.     PMH:  The following were reviewed and entered/updated in epic: Past Medical History:  Diagnosis Date   Anxiety    Depression     Patient Active Problem List   Diagnosis Date Noted   Recurrent viral infection 06/20/2023   Sleep difficulties 06/20/2023   Class 1 obesity without serious comorbidity in adult 05/20/2023   Other fatigue 05/20/2023   Depression, major, single episode, moderate (HCC) 04/14/2022   Acne vulgaris 04/14/2022   Panic anxiety syndrome 04/14/2022    History reviewed. No pertinent surgical history.  History reviewed. No pertinent family history.  Medications- reviewed and updated Outpatient Medications Prior to Visit  Medication Sig Dispense Refill   escitalopram  (LEXAPRO ) 20 MG tablet Take 1 tablet (20 mg total) by mouth daily. 90 tablet 1   buPROPion  (WELLBUTRIN  XL) 300 MG 24 hr tablet Take 1 tablet (300 mg total) by mouth daily. (Patient not taking: Reported on 10/11/2024) 90 tablet 1   No facility-administered medications prior to visit.     Allergies  Allergen Reactions   Aspirin Hives   Augmentin [Amoxicillin-Pot Clavulanate]     Social History   Socioeconomic History   Marital status: Single    Spouse name: Not on file   Number of children: Not on file   Years of education: Not on file   Highest education level: Bachelor's degree (e.g., BA, AB, BS)  Occupational History   Not on file  Tobacco Use   Smoking status: Never    Passive exposure: Never   Smokeless tobacco: Never  Vaping Use   Vaping status: Never Used  Substance and Sexual Activity   Alcohol use: Not Currently    Comment: occ   Drug use: Never   Sexual activity: Never    Birth control/protection: None  Other Topics Concern   Not on file  Social History Narrative   Not on file   Social Drivers of Health   Financial Resource Strain: High Risk (10/11/2024)   Overall Financial Resource Strain (CARDIA)    Difficulty of Paying Living Expenses: Hard  Food Insecurity: Food Insecurity Present (10/11/2024)   Hunger Vital Sign  Worried About Programme Researcher, Broadcasting/film/video in the Last Year: Sometimes true    Ran Out of Food in the Last Year: Sometimes true  Transportation Needs: Unmet Transportation Needs (10/11/2024)   PRAPARE - Administrator, Civil Service (Medical): Yes    Lack of Transportation (Non-Medical): Yes  Physical Activity: Insufficiently Active (10/11/2024)   Exercise Vital Sign    Days of Exercise per Week: 2 days    Minutes of Exercise per Session: 20 min  Stress: Stress Concern Present (10/11/2024)   [employer] of Occupational Health - Occupational Stress Questionnaire    Feeling of Stress: To some extent  Social Connections: Unknown (10/11/2024)   Social Connection and Isolation Panel    Frequency of Communication with Friends and Family: Once a week    Frequency of Social Gatherings with Friends and Family: Once a week    Attends Religious Services: Patient declined    Database Administrator or Organizations:  Patient declined    Attends Banker Meetings: Not on file    Marital Status: Never married  Recent Concern: Social Connections - Socially Isolated (09/06/2024)   Social Connection and Isolation Panel    Frequency of Communication with Friends and Family: Three times a week    Frequency of Social Gatherings with Friends and Family: Twice a week    Attends Religious Services: Never    Database Administrator or Organizations: No    Attends Engineer, Structural: Not on file    Marital Status: Never married           Objective:  Physical Exam: BP 105/70 (BP Location: Left Arm, Patient Position: Sitting, Cuff Size: Large)   Pulse 63   Temp (!) 97 F (36.1 C) (Temporal)   Resp 18   Wt 241 lb 6.4 oz (109.5 kg)   SpO2 96%   BMI 33.67 kg/m   Body mass index is 33.67 kg/m. Wt Readings from Last 3 Encounters:  10/11/24 241 lb 6.4 oz (109.5 kg)  09/06/24 246 lb (111.6 kg)  05/20/23 227 lb 6.4 oz (103.1 kg)    Physical Exam          Physical Exam      Prior labs:   Recent Results (from the past 2160 hours)  TSH Rfx on Abnormal to Free T4     Status: None   Collection Time: 10/11/24 10:19 AM  Result Value Ref Range   TSH 2.210 0.450 - 4.500 uIU/mL  C-peptide     Status: None   Collection Time: 10/11/24 10:19 AM  Result Value Ref Range   C-Peptide 3.38 0.80 - 3.85 ng/mL  Insulin, random     Status: Abnormal   Collection Time: 10/11/24 10:19 AM  Result Value Ref Range   Insulin 19.4 (H) uIU/mL    Comment:       Reference Range  < or = 18.4 .       Risk:       Optimal          < or = 18.4       Moderate         NA       High             >18.4 .       Adult cardiovascular event risk category       cut points (optimal, moderate, high)       are based on Insulin Reference Interval  studies performed at [facility]       in 2022. [name]   Apo A1 + B + Ratio     Status: None   Collection Time: 10/11/24 10:19 AM  Result Value Ref Range    Apolipoprotein A-1 123 101 - 178 mg/dL   Apolipoprotein B 80 <09 mg/dL    Comment:                          Desirable               < 90                          Borderline High     90 -  99                          High               100 - 130                          Very High               >130      --------------------------------------------------           ASCVD RISK              THERAPEUTIC TARGET            CATEGORY                  APO B (mg/dL)         Very High Risk        <80 (if extreme risk <70)         High Risk             <90         Moderate Risk         <90    Apolipo. B/A-1 Ratio 0.7 0.0 - 0.7 ratio    Comment:                              Apolipoprotein B/A-1 Ratio                                            Male  Male                                   Avg.Risk  0.7   0.6                                2X Avg.Risk  0.9   0.9                                3X Avg.Risk  1.0   1.0   CRP High sensitivity     Status: None   Collection Time: 10/11/24 10:19 AM  Result Value Ref Range   CRP, High Sensitivity 0.900 0.000 - 5.000 mg/L    Comment: Note:  An elevated hs-CRP (>5 mg/L) should be repeated after 2 weeks to rule out recent infection or trauma.  Lipid panel     Status: None   Collection Time: 10/11/24 10:19 AM  Result Value Ref Range   Cholesterol 160 0 - 200 mg/dL    Comment: ATP III Classification       Desirable:  < 200 mg/dL               Borderline High:  200 - 239 mg/dL          High:  > = 759 mg/dL   Triglycerides 04.9 0.0 - 149.0 mg/dL    Comment: Normal:  <849 mg/dLBorderline High:  150 - 199 mg/dL   HDL 57.19 >60.99 mg/dL   VLDL 80.9 0.0 - 59.9 mg/dL   LDL Cholesterol 98 0 - 99 mg/dL   Total CHOL/HDL Ratio 4     Comment:                Men          Women1/2 Average Risk     3.4          3.3Average Risk          5.0          4.42X Average Risk          9.6          7.13X Average Risk          15.0          11.0                       NonHDL 116.85      Comment: NOTE:  Non-HDL goal should be 30 mg/dL higher than patient's LDL goal (i.e. LDL goal of < 70 mg/dL, would have non-HDL goal of < 100 mg/dL)  Hemoglobin J8r     Status: None   Collection Time: 10/11/24 10:19 AM  Result Value Ref Range   Hgb A1c MFr Bld 5.1 4.6 - 6.5 %    Comment: Glycemic Control Guidelines for People with Diabetes:Non Diabetic:  <6%Goal of Therapy: <7%Additional Action Suggested:  >8%   Microalbumin / creatinine urine ratio     Status: None   Collection Time: 10/11/24 10:19 AM  Result Value Ref Range   Microalb, Ur 0.7 0.0 - 1.9 mg/dL   Creatinine,U 752.9 mg/dL   Microalb Creat Ratio 3.0 0.0 - 30.0 mg/g  CBC with Differential/Platelet     Status: None   Collection Time: 10/11/24 10:19 AM  Result Value Ref Range   WBC 6.8 4.0 - 10.5 K/uL   RBC 5.48 4.22 - 5.81 Mil/uL   Hemoglobin 15.9 13.0 - 17.0 g/dL   HCT 53.7 60.9 - 47.9 %   MCV 84.3 78.0 - 100.0 fl   MCHC 34.5 30.0 - 36.0 g/dL   RDW 87.0 88.4 - 84.4 %   Platelets 200.0 150.0 - 400.0 K/uL   Neutrophils Relative % 61.8 43.0 - 77.0 %   Lymphocytes Relative 29.1 12.0 - 46.0 %   Monocytes Relative 6.9 3.0 - 12.0 %   Eosinophils Relative 2.0 0.0 - 5.0 %   Basophils Relative 0.2 0.0 - 3.0 %   Neutro Abs 4.2 1.4 - 7.7 K/uL   Lymphs Abs 2.0 0.7 - 4.0 K/uL   Monocytes Absolute 0.5 0.1 - 1.0 K/uL   Eosinophils Absolute 0.1 0.0 - 0.7 K/uL   Basophils Absolute 0.0 0.0 -  0.1 K/uL  Comprehensive metabolic panel with GFR     Status: None   Collection Time: 10/11/24 10:19 AM  Result Value Ref Range   Sodium 137 135 - 145 mEq/L   Potassium 4.3 3.5 - 5.1 mEq/L   Chloride 101 96 - 112 mEq/L   CO2 29 19 - 32 mEq/L    Comment: Elevated LDH levels may cause falsely increased CO2 results. If LDH is >2000 U/L, a positive bias of 12% is possible.   Glucose, Bld 86 70 - 99 mg/dL   BUN 19 6 - 23 mg/dL   Creatinine, Ser 8.88 0.40 - 1.50 mg/dL   Total Bilirubin 0.6 0.2 - 1.2 mg/dL   Alkaline Phosphatase 54 39 - 117 U/L    AST 13 0 - 37 U/L   ALT 14 0 - 53 U/L   Total Protein 7.9 6.0 - 8.3 g/dL   Albumin 5.0 3.5 - 5.2 g/dL   GFR 08.99 >39.99 mL/min    Comment: Calculated using the CKD-EPI Creatinine Equation (2021)   Calcium 9.6 8.4 - 10.5 mg/dL  Urinalysis w microscopic + reflex cultur     Status: None   Collection Time: 10/11/24 10:19 AM   Specimen: Serum  Result Value Ref Range   Color, Urine YELLOW YELLOW   APPearance CLEAR CLEAR   Specific Gravity, Urine 1.027 1.001 - 1.035   pH 5.5 5.0 - 8.0   Glucose, UA NEGATIVE NEGATIVE   Bilirubin Urine NEGATIVE NEGATIVE   Ketones, ur NEGATIVE NEGATIVE   Hgb urine dipstick NEGATIVE NEGATIVE   Protein, ur NEGATIVE NEGATIVE   Nitrites, Initial NEGATIVE NEGATIVE   Leukocyte Esterase NEGATIVE NEGATIVE   WBC, UA NONE SEEN 0 - 5 /HPF   RBC / HPF NONE SEEN 0 - 2 /HPF   Squamous Epithelial / HPF NONE SEEN < OR = 5 /HPF   Bacteria, UA NONE SEEN NONE SEEN /HPF   Hyaline Cast NONE SEEN NONE SEEN /LPF   Note      Comment: This urine was analyzed for the presence of WBC,  RBC, bacteria, casts, and other formed elements.  Only those elements seen were reported. . .   Iron, TIBC and Ferritin Panel     Status: None (Preliminary result)   Collection Time: 10/11/24 10:19 AM  Result Value Ref Range   Iron 97 50 - 195 mcg/dL   TIBC 646 749 - 574 mcg/dL (calc)   %SAT 27 20 - 48 % (calc)  B12 and Folate Panel     Status: None   Collection Time: 10/11/24 10:19 AM  Result Value Ref Range   Vitamin B-12 245 211 - 911 pg/mL   Folate 8.1 >5.9 ng/mL  Lyme Disease Serology w/Reflex     Status: None   Collection Time: 10/11/24 10:19 AM  Result Value Ref Range   Lyme Total Antibody EIA Negative Negative    Comment: Lyme antibodies not detected. Reflex testing is not indicated. No laboratory evidence of infection with B. burgdorferi (Lyme disease). Negative results may occur in patients recently infected (less than or equal to 14 days) with B. burgdorferi.  If recent  infection is suspected, repeat testing on a new sample collected in 7 to 14 days is recommended.   REFLEXIVE URINE CULTURE     Status: None   Collection Time: 10/11/24 10:19 AM  Result Value Ref Range   Reflexve Urine Culture      Comment: NO CULTURE INDICATED    Lab Results  Component  Value Date   CHOL 160 10/11/2024   CHOL 170 05/20/2023   CHOL 162 04/14/2022   Lab Results  Component Value Date   HDL 42.80 10/11/2024   HDL 45.40 05/20/2023   HDL 35.90 (L) 04/14/2022   Lab Results  Component Value Date   LDLCALC 98 10/11/2024   LDLCALC 109 (H) 05/20/2023   LDLCALC 99 04/14/2022   Lab Results  Component Value Date   TRIG 95.0 10/11/2024   TRIG 77.0 05/20/2023   TRIG 132.0 04/14/2022   Lab Results  Component Value Date   CHOLHDL 4 10/11/2024   CHOLHDL 4 05/20/2023   CHOLHDL 5 04/14/2022   No results found for: LDLDIRECT  Last metabolic panel Lab Results  Component Value Date   GLUCOSE 86 10/11/2024   NA 137 10/11/2024   K 4.3 10/11/2024   CL 101 10/11/2024   CO2 29 10/11/2024   BUN 19 10/11/2024   CREATININE 1.11 10/11/2024   GFR 91.00 10/11/2024   CALCIUM 9.6 10/11/2024   PROT 7.9 10/11/2024   ALBUMIN 5.0 10/11/2024   BILITOT 0.6 10/11/2024   ALKPHOS 54 10/11/2024   AST 13 10/11/2024   ALT 14 10/11/2024   ANIONGAP 6 02/01/2017    Lab Results  Component Value Date   HGBA1C 5.1 10/11/2024    Last CBC Lab Results  Component Value Date   WBC 6.8 10/11/2024   HGB 15.9 10/11/2024   HCT 46.2 10/11/2024   MCV 84.3 10/11/2024   MCH 29.7 02/01/2017   RDW 12.9 10/11/2024   PLT 200.0 10/11/2024    Lab Results  Component Value Date   TSH 2.210 10/11/2024    No results found for: PSA1, PSA  Last vitamin D  No results found for: MARIEN BOLLS, VD25OH  Lab Results  Component Value Date   BILIRUBINUR NEGATIVE 05/20/2023   PROTEINUR NEGATIVE 10/11/2024   UROBILINOGEN 0.2 05/20/2023   LEUKOCYTESUR NEGATIVE 05/20/2023     Lab Results  Component Value Date   MICROALBUR 0.7 10/11/2024    At today's visit, we discussed treatment options, associated risk and benefits, and engage in counseling as needed.  Additionally the following were reviewed: Past medical records, past medical and surgical history, family and social background, as well as relevant laboratory results, imaging findings, and specialty notes, where applicable.   This message was generated using dictation software, and as a result, it may contain unintentional typos or errors.  Nevertheless, extensive effort was made to accurately convey at the pertinent aspects of the patient visit.     There may have been are other unrelated non-urgent complaints, but due to the busy schedule and the amount of time already spent with him, time does not permit to address these issues at today's visit. Another appointment may have or has been requested to review these additional issues.      Beverley KATHEE Hummer, MD  I,Emily Lagle,acting as a scribe for Beverley KATHEE Hummer, MD.,have documented all relevant documentation on the behalf of Beverley KATHEE Hummer, MD,as directed by  Beverley KATHEE Hummer, MD while in the presence of Beverley KATHEE Hummer, MD.   I, Beverley KATHEE Hummer, MD, have reviewed all documentation for this visit. The documentation on 10/11/2024 for the exam, diagnosis, procedures, and orders are all accurate and complete.

## 2024-10-18 LAB — VITAMIN D 1,25 DIHYDROXY
Vitamin D 1, 25 (OH)2 Total: 41 pg/mL (ref 18–72)
Vitamin D2 1, 25 (OH)2: <8 pg/mL
Vitamin D3 1, 25 (OH)2: 41 pg/mL

## 2024-10-18 LAB — URINALYSIS W MICROSCOPIC + REFLEX CULTURE
Bacteria, UA: NONE SEEN /HPF
Bilirubin Urine: NEGATIVE
Glucose, UA: NEGATIVE
Hgb urine dipstick: NEGATIVE
Hyaline Cast: NONE SEEN /LPF
Ketones, ur: NEGATIVE
Leukocyte Esterase: NEGATIVE
Nitrites, Initial: NEGATIVE
Protein, ur: NEGATIVE
RBC / HPF: NONE SEEN /HPF (ref 0–2)
Specific Gravity, Urine: 1.027 (ref 1.001–1.035)
Squamous Epithelial / HPF: NONE SEEN /HPF (ref <=5)
WBC, UA: NONE SEEN /HPF (ref 0–5)
pH: 5.5 (ref 5.0–8.0)

## 2024-10-18 LAB — IRON,TIBC AND FERRITIN PANEL
%SAT: 27 % (ref 20–48)
Ferritin: 71 ng/mL (ref 38–380)
Iron: 97 ug/dL (ref 50–380)
TIBC: 353 ug/dL (ref 250–425)

## 2024-10-18 LAB — C-PEPTIDE: C-Peptide: 3.38 ng/mL (ref 0.80–3.85)

## 2024-10-18 LAB — NO CULTURE INDICATED

## 2024-10-18 LAB — INSULIN, RANDOM: Insulin: 19.4 u[IU]/mL — ABNORMAL HIGH

## 2024-11-21 ENCOUNTER — Telehealth (INDEPENDENT_AMBULATORY_CARE_PROVIDER_SITE_OTHER): Admitting: Family Medicine

## 2024-11-21 DIAGNOSIS — F410 Panic disorder [episodic paroxysmal anxiety] without agoraphobia: Secondary | ICD-10-CM

## 2024-11-21 DIAGNOSIS — F321 Major depressive disorder, single episode, moderate: Secondary | ICD-10-CM | POA: Diagnosis not present

## 2024-11-21 DIAGNOSIS — F5105 Insomnia due to other mental disorder: Secondary | ICD-10-CM

## 2024-11-21 MED ORDER — MIRTAZAPINE 7.5 MG PO TABS: 7.5000 mg | ORAL_TABLET | Freq: Every day | ORAL | 3 refills | Status: AC

## 2024-11-21 MED ORDER — ESCITALOPRAM OXALATE 20 MG PO TABS: 20.0000 mg | ORAL_TABLET | Freq: Every day | ORAL | 3 refills | Status: AC

## 2024-11-21 NOTE — Progress Notes (Signed)
 Virtual Visit via Video Note  I connected with Miguel Dunn on 11/21/2024 at  1:00 PM EST by a video enabled telemedicine application and verified that I am speaking with the correct person using two identifiers.  Location: Patient: home Provider: office   I discussed the limitations of evaluation and management by telemedicine and the availability of in person appointments. The patient expressed understanding and agreed to proceed.    Chief Complaint  Patient presents with   Medical Management of Chronic Issues    Pt presents today via video for a f/u for depression anxiety and insomnia   Discussed the use of AI scribe software for clinical note transcription with the patient, who gave verbal consent to proceed.  History of Present Illness Miguel Dunn is a 28 year old male who presents with daytime sleepiness and mood improvement on mirtazapine .  Daytime sleepiness - Significant daytime sleepiness since starting mirtazapine , causing him to drift off during the day - Sleepiness present at both 7.5 mg and 15 mg doses of mirtazapine  - Drowsiness hinders ability to act on increased motivation  Mood disturbance and pharmacologic management - Insomnia initially related to stress about his mother's health - Started mirtazapine  for insomnia and mood symptoms; current dose 15 mg (started at 7.5 mg) - Mood improvement and increased motivation since starting mirtazapine , despite persistent drowsiness - Concurrent use of escitalopram  20 mg, which he has been taking prior to mirtazapine  - Mood has improved since starting the combination of medications  Psychosocial stressors - Ongoing stress related to his mother's health, including evaluation by an oncologist - Mother tested positive for Lyme disease but does not qualify for treatment due to insufficient test levels  Recent laboratory evaluation - Recent labs (end of October) included thyroid  function, c-peptide, protein,  cholesterol, and A1c of 5.1 - Blood counts, liver, kidney, and electrolyte levels assessed - Iron, B12, folate, vitamin D , and Lyme disease antibody testing included in recent labs      11/21/2024    1:01 PM 10/11/2024    9:13 AM 10/11/2024    9:03 AM 09/06/2024   10:01 AM 06/20/2023   10:42 AM  Depression screen PHQ 2/9  Decreased Interest 1 2 0 3 1  Down, Depressed, Hopeless 2 2 0 2 3  PHQ - 2 Score 3 4 0 5 4  Altered sleeping 3 1  2 1   Tired, decreased energy 2 3  3 3   Change in appetite 0 0  0 0  Feeling bad or failure about yourself  2 2  3 2   Trouble concentrating 0 1  1 2   Moving slowly or fidgety/restless 2 0  0 0  Suicidal thoughts 0 0  0 0  PHQ-9 Score 12 11   14  12    Difficult doing work/chores Very difficult Very difficult  Somewhat difficult Very difficult     Data saved with a previous flowsheet row definition      11/21/2024    1:03 PM 10/11/2024    9:14 AM 09/06/2024   10:01 AM 06/20/2023   10:44 AM  GAD 7 : Generalized Anxiety Score  Nervous, Anxious, on Edge 1 2 3 1   Control/stop worrying 1 1 3 2   Worry too much - different things 2 2 3 2   Trouble relaxing 0 1 2 0  Restless 0 0 1 0  Easily annoyed or irritable 1 2 2 1   Afraid - awful might happen 1 1 3 1   Total GAD 7 Score 6  9 17 7   Anxiety Difficulty Somewhat difficult  Extremely difficult Somewhat difficult      Observations/Objective: There were no vitals filed for this visit.  Gen: NAD, resting comfortably HEENT: EOMI Pulm: NWOB Skin: no rash on face Neuro: no facial asymmetry or dysmetria Psych: Normal affect   Assessment and Plan: Assessment and Plan Assessment & Plan Major depressive disorder with panic anxiety disorder Mood has improved with escitalopram  20 mg and mirtazapine . Mirtazapine  at 15 mg caused significant daytime sleepiness, impacting daily activities. Mood is improving but sleepiness is a drawback. Reducing mirtazapine  to 7.5 mg may alleviate sleepiness without  significantly affecting mood. If mood stagnates, consider retrialing bupropion . - Reduced mirtazapine  to 7.5 mg to assess impact on daytime sleepiness. - Monitor mood and sleepiness; if mood stagnates, will consider retrialing bupropion .  I      I discussed the assessment and treatment plan with the patient. The patient was provided an opportunity to ask questions and all were answered. The patient agreed with the plan and demonstrated an understanding of the instructions.   The patient was advised to call back or seek an in-person evaluation if the symptoms worsen or if the condition fails to improve as anticipated.  Beverley KATHEE Hummer, MD  I,Emily Lagle,acting as a scribe for Beverley KATHEE Hummer, MD.,have documented all relevant documentation on the behalf of Beverley KATHEE Hummer, MD.  LILLETTE Beverley KATHEE Hummer, MD, have reviewed all documentation for this visit. The documentation on 11/21/2024 for the exam, diagnosis, procedures, and orders are all accurate and complete.

## 2025-04-24 ENCOUNTER — Ambulatory Visit: Admitting: Physician Assistant
